# Patient Record
Sex: Female | Born: 1960 | Race: Black or African American | Hispanic: No | Marital: Single | Smoking: Former smoker
Health system: Southern US, Community
[De-identification: ages and names within clinical notes are randomized; demographics above are authoritative.]

## PROBLEM LIST (undated history)

## (undated) DIAGNOSIS — I10 Essential (primary) hypertension: Secondary | ICD-10-CM

## (undated) DIAGNOSIS — C801 Malignant (primary) neoplasm, unspecified: Secondary | ICD-10-CM

## (undated) DIAGNOSIS — J45909 Unspecified asthma, uncomplicated: Secondary | ICD-10-CM

## (undated) DIAGNOSIS — C539 Malignant neoplasm of cervix uteri, unspecified: Secondary | ICD-10-CM

## (undated) DIAGNOSIS — D219 Benign neoplasm of connective and other soft tissue, unspecified: Secondary | ICD-10-CM

## (undated) HISTORY — PX: ECTOPIC PREGNANCY SURGERY: SHX613

## (undated) HISTORY — PX: FRACTURE SURGERY: SHX138

## (undated) HISTORY — PX: CHOLECYSTECTOMY: SHX55

---

## 2007-07-14 ENCOUNTER — Emergency Department (HOSPITAL_COMMUNITY): Admission: EM | Admit: 2007-07-14 | Discharge: 2007-07-14 | Payer: Self-pay | Admitting: Pediatrics

## 2010-08-14 ENCOUNTER — Emergency Department (HOSPITAL_BASED_OUTPATIENT_CLINIC_OR_DEPARTMENT_OTHER)
Admission: EM | Admit: 2010-08-14 | Discharge: 2010-08-15 | Disposition: A | Discharge status: Other Acute Inpatient Hospital | Payer: Medicaid - Out of State | Attending: Emergency Medicine | Admitting: Emergency Medicine

## 2010-08-14 DIAGNOSIS — I10 Essential (primary) hypertension: Secondary | ICD-10-CM | POA: Insufficient documentation

## 2010-08-14 DIAGNOSIS — D259 Leiomyoma of uterus, unspecified: Secondary | ICD-10-CM | POA: Insufficient documentation

## 2010-08-14 DIAGNOSIS — R1013 Epigastric pain: Secondary | ICD-10-CM | POA: Insufficient documentation

## 2010-08-14 DIAGNOSIS — A599 Trichomoniasis, unspecified: Secondary | ICD-10-CM | POA: Insufficient documentation

## 2010-08-14 DIAGNOSIS — J45909 Unspecified asthma, uncomplicated: Secondary | ICD-10-CM | POA: Insufficient documentation

## 2010-08-14 DIAGNOSIS — N390 Urinary tract infection, site not specified: Secondary | ICD-10-CM | POA: Insufficient documentation

## 2010-08-14 DIAGNOSIS — Z79899 Other long term (current) drug therapy: Secondary | ICD-10-CM | POA: Insufficient documentation

## 2010-08-14 DIAGNOSIS — K7689 Other specified diseases of liver: Secondary | ICD-10-CM | POA: Insufficient documentation

## 2010-08-14 DIAGNOSIS — N83209 Unspecified ovarian cyst, unspecified side: Secondary | ICD-10-CM | POA: Insufficient documentation

## 2010-08-15 ENCOUNTER — Inpatient Hospital Stay (HOSPITAL_COMMUNITY): Payer: Medicaid - Out of State

## 2010-08-15 ENCOUNTER — Inpatient Hospital Stay (HOSPITAL_COMMUNITY)
Admission: AD | Admit: 2010-08-15 | Discharge: 2010-08-17 | DRG: 440 | Disposition: A | Discharge status: Home / Self Care | Payer: Medicaid - Out of State | Source: Other Acute Inpatient Hospital | Attending: Internal Medicine | Admitting: Internal Medicine

## 2010-08-15 ENCOUNTER — Emergency Department (INDEPENDENT_AMBULATORY_CARE_PROVIDER_SITE_OTHER): Payer: Medicaid - Out of State

## 2010-08-15 DIAGNOSIS — A5901 Trichomonal vulvovaginitis: Secondary | ICD-10-CM | POA: Diagnosis present

## 2010-08-15 DIAGNOSIS — K7689 Other specified diseases of liver: Secondary | ICD-10-CM | POA: Diagnosis present

## 2010-08-15 DIAGNOSIS — R109 Unspecified abdominal pain: Secondary | ICD-10-CM

## 2010-08-15 DIAGNOSIS — J45909 Unspecified asthma, uncomplicated: Secondary | ICD-10-CM | POA: Diagnosis present

## 2010-08-15 DIAGNOSIS — K5289 Other specified noninfective gastroenteritis and colitis: Secondary | ICD-10-CM | POA: Diagnosis present

## 2010-08-15 DIAGNOSIS — I10 Essential (primary) hypertension: Secondary | ICD-10-CM | POA: Diagnosis present

## 2010-08-15 DIAGNOSIS — G43909 Migraine, unspecified, not intractable, without status migrainosus: Secondary | ICD-10-CM | POA: Diagnosis present

## 2010-08-15 DIAGNOSIS — K859 Acute pancreatitis without necrosis or infection, unspecified: Principal | ICD-10-CM | POA: Diagnosis present

## 2010-08-15 DIAGNOSIS — K7460 Unspecified cirrhosis of liver: Secondary | ICD-10-CM

## 2010-08-15 DIAGNOSIS — D259 Leiomyoma of uterus, unspecified: Secondary | ICD-10-CM | POA: Diagnosis present

## 2010-08-15 DIAGNOSIS — R112 Nausea with vomiting, unspecified: Secondary | ICD-10-CM

## 2010-08-15 DIAGNOSIS — N83209 Unspecified ovarian cyst, unspecified side: Secondary | ICD-10-CM | POA: Diagnosis present

## 2010-08-15 LAB — CBC
HCT: 41.1 % (ref 36.0–46.0)
MCV: 77.7 fL — ABNORMAL LOW (ref 78.0–100.0)
RBC: 5.29 MIL/uL — ABNORMAL HIGH (ref 3.87–5.11)
WBC: 8.4 10*3/uL (ref 4.0–10.5)

## 2010-08-15 LAB — COMPREHENSIVE METABOLIC PANEL
Alkaline Phosphatase: 241 U/L — ABNORMAL HIGH (ref 39–117)
BUN: 8 mg/dL (ref 6–23)
CO2: 27 mEq/L (ref 19–32)
Chloride: 104 mEq/L (ref 96–112)
Glucose, Bld: 112 mg/dL — ABNORMAL HIGH (ref 70–99)
Potassium: 3.5 mEq/L (ref 3.5–5.1)
Total Bilirubin: 2.3 mg/dL — ABNORMAL HIGH (ref 0.3–1.2)

## 2010-08-15 LAB — LIPID PANEL
HDL: 55 mg/dL (ref >39)
Total CHOL/HDL Ratio: 3 RATIO

## 2010-08-15 LAB — URINALYSIS, ROUTINE W REFLEX MICROSCOPIC
Hgb urine dipstick: NEGATIVE
Hgb urine dipstick: NEGATIVE
Ketones, ur: NEGATIVE mg/dL
Nitrite: NEGATIVE
Protein, ur: NEGATIVE mg/dL
Protein, ur: NEGATIVE mg/dL
Urobilinogen, UA: 0.2 mg/dL (ref 0.0–1.0)
Urobilinogen, UA: 1 mg/dL (ref 0.0–1.0)

## 2010-08-15 LAB — URINE MICROSCOPIC-ADD ON

## 2010-08-15 LAB — DIFFERENTIAL
Eosinophils Relative: 3 % (ref 0–5)
Lymphocytes Relative: 17 % (ref 12–46)
Lymphs Abs: 1.5 10*3/uL (ref 0.7–4.0)
Neutrophils Relative %: 74 % (ref 43–77)

## 2010-08-15 LAB — LIPASE, BLOOD: Lipase: 880 U/L — ABNORMAL HIGH (ref 23–300)

## 2010-08-15 LAB — WET PREP, GENITAL: Yeast Wet Prep HPF POC: NONE SEEN

## 2010-08-15 MED ORDER — IOHEXOL 300 MG/ML  SOLN
100.0000 mL | Freq: Once | INTRAMUSCULAR | Status: AC | PRN
  Administered 2010-08-15: 100 mL via INTRAVENOUS

## 2010-08-16 LAB — COMPREHENSIVE METABOLIC PANEL
ALT: 338 U/L — ABNORMAL HIGH (ref 0–35)
Albumin: 3.3 g/dL — ABNORMAL LOW (ref 3.5–5.2)
Alkaline Phosphatase: 129 U/L — ABNORMAL HIGH (ref 39–117)
Chloride: 103 mEq/L (ref 96–112)
Glucose, Bld: 110 mg/dL — ABNORMAL HIGH (ref 70–99)
Potassium: 3.1 mEq/L — ABNORMAL LOW (ref 3.5–5.1)
Sodium: 139 mEq/L (ref 135–145)
Total Bilirubin: 1.2 mg/dL (ref 0.3–1.2)
Total Protein: 6.7 g/dL (ref 6.0–8.3)

## 2010-08-16 LAB — HEPATIC FUNCTION PANEL
AST: 63 U/L — ABNORMAL HIGH (ref 0–37)
Albumin: 3.4 g/dL — ABNORMAL LOW (ref 3.5–5.2)
Alkaline Phosphatase: 142 U/L — ABNORMAL HIGH (ref 39–117)
Total Bilirubin: 0.9 mg/dL (ref 0.3–1.2)

## 2010-08-16 LAB — URINE CULTURE: Culture: NO GROWTH

## 2010-08-17 LAB — COMPREHENSIVE METABOLIC PANEL
AST: 45 U/L — ABNORMAL HIGH (ref 0–37)
BUN: 5 mg/dL — ABNORMAL LOW (ref 6–23)
CO2: 28 mEq/L (ref 19–32)
Chloride: 104 mEq/L (ref 96–112)
Creatinine, Ser: 0.82 mg/dL (ref 0.4–1.2)
GFR calc non Af Amer: >60 mL/min (ref >60)
Glucose, Bld: 108 mg/dL — ABNORMAL HIGH (ref 70–99)
Total Bilirubin: 0.8 mg/dL (ref 0.3–1.2)

## 2010-08-17 LAB — URINE CULTURE
Culture  Setup Time: 201202100332
Special Requests: NEGATIVE

## 2010-08-18 NOTE — H&P (Signed)
Erica Huffman              ACCOUNT NO.:  [id number]  MEDICAL RECORD NO.:  [id number]           PATIENT TYPE:  I  LOCATION:  5019                         FACILITY:  [facility]  PHYSICIAN:  Mariea Stable, MD   DATE OF BIRTH:  1961/05/15  DATE OF ADMISSION:  08/15/2010 DATE OF DISCHARGE:                             HISTORY & PHYSICAL   PRIMARY CARE PHYSICIAN:  Dr. Gillie Manners.  CHIEF COMPLAINT:  Abdominal pain.  HISTORY OF PRESENT ILLNESS:  Erica Huffman is a 50 year old woman who presents with chief complaint of abdominal pain.  Pain started approximately 2 days prior to admission.  She states she had a headache at that time, which is common for her, and she took some NSAIDs followed by epigastric pain that was described as a knotted feeling in the epigastrium associated with cramps.  This was followed by associated nausea and vomiting that actually improved after a few bouts of emesis. She describes the pain has subsequently been markedly relieved and allowing for her to tolerate some food yesterday.  She states she started with a relatively light diet and then was able to advance somewhat.  Today, she has continued to tolerate food but has developed increasing abdominal pain again.  This time it is mainly in the epigastric area but kind of radiates in a circle diffusely around her abdomen.  She states that her right lower quadrant seems to be more painful than other areas.  She has began to have her headache once again.  She again says that this is not a typical for her and she suffers from migraines that are typically relieved by NSAIDs.  Again, the patient states that the pain initially got better but has now returned and is moderate-to-severe in intensity.  She reports not being able to find a comfortable position as pain does not improve despite different positions.  She has not necessarily found anything that makes the pain better and currently no aggravating  triggers.  PAST MEDICAL HISTORY: 1. Asthma. 2. Migraines. 3. Kidney stones. 4. Endometriosis. 5. Hypertension.  MEDICATIONS: 1. She uses ibuprofen or naproxen p.r.n. headache. 2. Albuterol p.r.n. shortness of breath, wheezing. 3. Hydrochlorothiazide 25 mg p.o. daily.  ALLERGIES:  She has no known drug allergies.  SOCIAL HISTORY:  The patient is currently residing in [place] although she still has her apartment in [place].  She has a 40 year old son which is going to high school here.  They reside with her mother while her son is in school.  She states that she frequently returns to [place] to check up on the apartment approximately once a month or so. Her plan was to move to [facility] although she is not sure how likely this will be.  She is currently single and she denies any history of tobacco, alcohol, or drug use.  FAMILY HISTORY:  Negative/noncontributory.  REVIEW OF SYSTEMS:  As per HPI.  All others reviewed and negative.  PHYSICAL EXAM:  VITAL SIGNS:  Temperature 98.7, blood pressure 141/94, pulse of 114, respirations 16, oxygen saturation 96% on room air. GENERAL:  She is an overweight woman lying in bed  in no acute distress. HEENT:  Normocephalic, atraumatic.  Pupils equally round and reactive to light and accommodation.  Extraocular movements are intact.  Sclerae are anicteric.  Mucous membranes are moist. NECK:  Supple.  There is no JVD.  No carotid bruits. HEART:  S1-S2 with a slightly tachy rate, regular rhythm.  No murmurs, gallops or rubs. LUNGS:  Clear to auscultation bilaterally with good air movement. ABDOMEN:  Positive bowel sounds.  It is soft and diffusely tender. Tenderness is most notable in the epigastric area.  There is no rebound or guarding. EXTREMITIES:  There is no cyanosis, clubbing or edema. NEUROLOGIC:  The patient is awake, alert, and oriented x3.  Cranial nerves are intact.  Motor is intact.  Sensation is intact.  LABORATORY DATA:   Urine pregnancy test is negative.  Urinalysis shows small leukocyte esterase with 7-10 wbc's.  WBC 8.4, hemoglobin 13.6, platelet count 253.  Sodium 141, potassium 3.5, chloride 104, bicarb 27, glucose 112, BUN 8, creatinine 0.8, bilirubin 2.3, alkaline phosphatase 241, AST 381, ALT 773, protein 8.0, albumin 4.1, calcium 9.1, lipase 880.  Wet prep significant for few Trichomonas and few WBCs.  IMAGING:  CT abdomen and pelvis with contrast.  Impression: 1. Diffuse hepatic steatosis. 2. Large posterior uterine fibroid 3. Left ovarian cyst. 4. Suspected proximal enteritis. 5. Stranding in the roots of the mesentery, technically nonspecific.  ASSESSMENT AND PLAN: 1. Abdominal pain most likely secondary to pancreatitis (likely     gallstone induced).  The patient has risk factors for gallstones     including female gender, age, and obesity.  Given her current     laboratory data with an elevated bilirubin, elevated alkaline     phosphatase, and ALT double her AST suggest gallstones etiology.     She does have a CT scan without any mentioned, however, this is     less sensitive than abdominal ultrasound.  At this point we will     admit, will hydrate aggressively, keep her n.p.o. and order an     abdominal ultrasound to further assess.  I will provide IV pain     medication along with nausea medications p.r.n.  We will need to     recheck LFTs at some point tomorrow to assess trend as it is     possible that she may have passed a gallstone.  Depending on her     clinical progress and lab values and ultrasound findings, may need     a GI consultation for the ERCP if ultrasound demonstrates a dilated     common bile duct and/or LFTs progressively worsen.  Furthermore, if     the patient is noted to have gallstones, she will likely need a     cholecystectomy at some point to prevent further recurrences.  Less     likely due to alcohol as the patient denies any use of.  Also     possible but  unlikely is hydrochlorothiazide as the etiology as the     patient has been on this medication for sometime.  We will go ahead     and check a fasting lipid panel just to make sure that     triglycerides are not grossly elevated, although again this is most     likely secondary to gallstone given the current laboratory data. 2. Trichomoniasis.  We will treat with a single dose of metronidazole. 3. Elevated transaminases.  Again, this is per #1.  However, if no  evidence of gallstone on ultrasound, may consider further workup      (viral hepatitis, steatohepatitis, etc) if LFTs do not normalize      rapidly.  Again, I presume this is secondary to gallstone and even      if the ultrasound is negative, may be secondary to a stone having had      passed.     Mariea Stable, MD     MA/MEDQ  D:  08/15/2010  T:  08/15/2010  Job:  [id number]  cc:   Gillie Manners, MD  Electronically Signed by Mariea Stable MD on 08/16/2010 01:51:13 PM

## 2010-08-28 NOTE — Discharge Summary (Signed)
NAMEHAYES, REHFELDT              ACCOUNT NO.:  [id number]  MEDICAL RECORD NO.:  [id number]           PATIENT TYPE:  LOCATION:                                 FACILITY:  PHYSICIAN:  Clydia Llano, MD       DATE OF BIRTH:  [birth year]  DATE OF ADMISSION: DATE OF DISCHARGE:                              DISCHARGE SUMMARY   PRIMARY CARE PHYSICIAN:  Dr. Gillie Manners  REASON FOR ADMISSION:  Abdominal pain.  DISCHARGE DIAGNOSES: 1. Acute pancreatitis. 2. Trichomonas vaginitis. 3. Diffuse hepatic steatosis 4. Large posterior uterine fibroid. 5. Left ovarian cyst. 6. Probable proximal enteritis.  SECONDARY DIAGNOSES: 1. Asthma. 2. Migraines. 3. History of endometriosis. 4. History of a kidney stones.  DISCHARGE MEDICATIONS: 1. Albuterol inhaler 90 mcg 1 puff inhaled every 4 hours as needed for     wheezing and shortness of breath. 2. Naprosyn 500 mg 3 times daily as needed for pain.  BRIEF HISTORY AND EXAMINATION:  Erica Huffman is a 50 year old African American female with history of migraines and asthma.  The patient came into the hospital complaining about abdominal pain.  The patient has pain approximately 2 days prior to the admission. She described abdominal pain as colicky epigastric associated with cramps.  Followed by nausea and vomiting and improved actually about few bouts of emesis. The patient described that the pain is markedly relieved, allowed her to tolerate some food the day before admission.  She stated that she started to relatively light diet when she is able to advance somewhat. Anyway at the day of admission, the patient was able to eat light diet, but continued increasing abdominal cramps.  At this time, it was in the epigastric area, radiates to circle diffusely in the abdomen.  She stated that her right lower quadrant seems to be painful more than other areas in her stomach.  Upon initial evaluation in the emergency department, the patient was found to  have lipase of 880 consistent with acute pancreatitis and her wet prep showed few trichomonas.  RADIOLOGY: 1. Ultrasound of abdomen showed cholelithiasis without evidence of     cholecystitis or biliary dilatation.  No sonographic evidence of     complicated pancreatitis. 2. CT of abdomen and pelvis showed diffuse hepatic steatosis, large     posterior uterine fibroid.  Left ovarian cyst and suspected     proximal enteritis and stranding in the root of the mesentry,     technically nonspecific.  BRIEF HOSPITAL COURSE:  The patient is admitted to the hospital for further evaluation. 1. Acute pancreatitis:  The patient is admitted to the hospital and     was put on n.p.o.  Then, her diet advanced as she tolerates food.     The patient was put on PPI during the hospital stay as well as Tums     for heartburn.  The patient was doing better and she was able to     tolerate sodium food since dinner yesterday.  The patient is not a     drinker.  CT scan and ultrasound did not show gallstones.     Triglycerides  were 56, within normal limits.  The patient does not     have to take any medications that can cause pancreatitis.  The     patient's pancreatitis is probably idiopathic.  The patient is to     follow up with her primary care physician. 2. Increased LFTs:  If followed initially.  Thought initially, the     patient has gallstone which caused pancreatitis and increasing the     LFTs, but CT scan and ultrasound did not show evidence of     obstructing gallstone.  The patient has gallstones, but no     cholecystitis or obstructing gallstone in the common bile duct.     The increased LFTs is probably secondary to diffuse hepatic     steatosis.  Also, hepatitis panel was obtained, result was not back     until the time of discharge. 3. Posterior uterine fibroid posterior measured 10 x 1 x 7.2 x 8 cm     and left ovarian cyst measured 2.9 cm in diameter.  These are     incidental findings.   The patient does not have any complaints from     them.  The patient is to follow up with her primary care physician     and instructed if any symptoms or increased vaginal bleeding to     report to her primary care physician or closest ED. 4. Possible enteritis showed in the CT scan.  The patient was treated     symptomatically with PPI and antiemetics.  The patient is     tolerating food okay. 5. Trichomonas vaginitis.  The patient is counseled about safe sex and     to treat her partner as well.  The patient has high dose between 2     g of Flagyl in the ED. 6. Migraine headaches.  The patient is having migraine headaches every     now and then.  During the hospital, one day she had headache which     is relieved by NSAIDs.  DISCHARGE INSTRUCTIONS:  Activity:  As tolerated.  Disposition:  Home.  Diet:  Regular diet.     Clydia Llano, MD     ME/MEDQ  D:  08/17/2010  T:  08/18/2010  Job:  [id number]  cc:   Gillie Manners, MD  Electronically Signed by Clydia Llano  on 08/28/2010 09:26:52 PM

## 2015-04-12 ENCOUNTER — Emergency Department (HOSPITAL_COMMUNITY)
Admission: EM | Admit: 2015-04-12 | Discharge: 2015-04-12 | Disposition: A | Discharge status: Home / Self Care | Payer: Medicaid - Out of State | Attending: Emergency Medicine | Admitting: Emergency Medicine

## 2015-04-12 ENCOUNTER — Emergency Department (EMERGENCY_DEPARTMENT_HOSPITAL)
Admit: 2015-04-12 | Discharge: 2015-04-12 | Disposition: A | Discharge status: Home / Self Care | Payer: Medicaid - Out of State | Attending: Emergency Medicine | Admitting: Emergency Medicine

## 2015-04-12 ENCOUNTER — Encounter (HOSPITAL_COMMUNITY): Payer: Self-pay | Admitting: Emergency Medicine

## 2015-04-12 ENCOUNTER — Emergency Department (HOSPITAL_COMMUNITY): Payer: Medicaid - Out of State

## 2015-04-12 DIAGNOSIS — J45909 Unspecified asthma, uncomplicated: Secondary | ICD-10-CM | POA: Diagnosis not present

## 2015-04-12 DIAGNOSIS — I82402 Acute embolism and thrombosis of unspecified deep veins of left lower extremity: Secondary | ICD-10-CM | POA: Insufficient documentation

## 2015-04-12 DIAGNOSIS — M25552 Pain in left hip: Secondary | ICD-10-CM | POA: Insufficient documentation

## 2015-04-12 DIAGNOSIS — M7989 Other specified soft tissue disorders: Secondary | ICD-10-CM

## 2015-04-12 DIAGNOSIS — I10 Essential (primary) hypertension: Secondary | ICD-10-CM | POA: Diagnosis not present

## 2015-04-12 DIAGNOSIS — Z3202 Encounter for pregnancy test, result negative: Secondary | ICD-10-CM | POA: Insufficient documentation

## 2015-04-12 DIAGNOSIS — R2242 Localized swelling, mass and lump, left lower limb: Secondary | ICD-10-CM | POA: Diagnosis present

## 2015-04-12 DIAGNOSIS — Z8541 Personal history of malignant neoplasm of cervix uteri: Secondary | ICD-10-CM | POA: Diagnosis not present

## 2015-04-12 DIAGNOSIS — Z87891 Personal history of nicotine dependence: Secondary | ICD-10-CM | POA: Insufficient documentation

## 2015-04-12 HISTORY — DX: Malignant (primary) neoplasm, unspecified: C80.1

## 2015-04-12 HISTORY — DX: Unspecified asthma, uncomplicated: J45.909

## 2015-04-12 HISTORY — DX: Essential (primary) hypertension: I10

## 2015-04-12 HISTORY — DX: Malignant neoplasm of cervix uteri, unspecified: C53.9

## 2015-04-12 LAB — BASIC METABOLIC PANEL
ANION GAP: 13 (ref 5–15)
BUN: 12 mg/dL (ref 6–20)
CALCIUM: 9.4 mg/dL (ref 8.9–10.3)
CO2: 25 mmol/L (ref 22–32)
CREATININE: 1.01 mg/dL — AB (ref 0.44–1.00)
Chloride: 102 mmol/L (ref 101–111)
Glucose, Bld: 107 mg/dL — ABNORMAL HIGH (ref 65–99)
Potassium: 3.7 mmol/L (ref 3.5–5.1)
Sodium: 140 mmol/L (ref 135–145)

## 2015-04-12 LAB — I-STAT BETA HCG BLOOD, ED (MC, WL, AP ONLY): I-stat hCG, quantitative: <5 m[IU]/mL (ref <5)

## 2015-04-12 LAB — CBC WITH DIFFERENTIAL/PLATELET
BASOS ABS: 0 10*3/uL (ref 0.0–0.1)
BASOS PCT: 0 %
EOS ABS: 0.1 10*3/uL (ref 0.0–0.7)
EOS PCT: 1 %
HEMATOCRIT: 41.7 % (ref 36.0–46.0)
Hemoglobin: 12.8 g/dL (ref 12.0–15.0)
Lymphocytes Relative: 12 %
Lymphs Abs: 0.8 10*3/uL (ref 0.7–4.0)
MCH: 26 pg (ref 26.0–34.0)
MCHC: 30.7 g/dL (ref 30.0–36.0)
MCV: 84.6 fL (ref 78.0–100.0)
MONO ABS: 0.4 10*3/uL (ref 0.1–1.0)
MONOS PCT: 6 %
Neutro Abs: 5.3 10*3/uL (ref 1.7–7.7)
Neutrophils Relative %: 81 %
PLATELETS: 167 10*3/uL (ref 150–400)
RBC: 4.93 MIL/uL (ref 3.87–5.11)
RDW: 14.5 % (ref 11.5–15.5)
WBC: 6.6 10*3/uL (ref 4.0–10.5)

## 2015-04-12 LAB — PROTIME-INR
INR: 1.16 (ref 0.00–1.49)
Prothrombin Time: 14.9 seconds (ref 11.6–15.2)

## 2015-04-12 MED ORDER — RIVAROXABAN (XARELTO) EDUCATION KIT FOR DVT/PE PATIENTS
PACK | Freq: Once | Status: AC
  Administered 2015-04-12: 19:00:00
  Filled 2015-04-12: qty 1

## 2015-04-12 MED ORDER — RIVAROXABAN 15 MG PO TABS
15.0000 mg | ORAL_TABLET | Freq: Two times a day (BID) | ORAL | Status: DC
  Administered 2015-04-12: 15 mg via ORAL
  Filled 2015-04-12 (×2): qty 1

## 2015-04-12 MED ORDER — OXYCODONE-ACETAMINOPHEN 5-325 MG PO TABS: 1.0000 | ORAL_TABLET | ORAL | Status: DC | PRN

## 2015-04-12 MED ORDER — XARELTO VTE STARTER PACK 15 & 20 MG PO TBPK: 15.0000 mg | ORAL_TABLET | ORAL | Status: DC

## 2015-04-12 MED ORDER — OXYCODONE-ACETAMINOPHEN 5-325 MG PO TABS
1.0000 | ORAL_TABLET | Freq: Once | ORAL | Status: AC
  Administered 2015-04-12: 1 via ORAL
  Filled 2015-04-12: qty 1

## 2015-04-12 MED ORDER — HYDROMORPHONE HCL 1 MG/ML IJ SOLN
1.0000 mg | Freq: Once | INTRAMUSCULAR | Status: AC
Start: 2015-04-12 — End: 2015-04-12
  Administered 2015-04-12: 1 mg via INTRAVENOUS
  Filled 2015-04-12: qty 1

## 2015-04-12 MED ORDER — RIVAROXABAN 20 MG PO TABS: 20.0000 mg | ORAL_TABLET | Freq: Every day | ORAL | Status: DC

## 2015-04-12 NOTE — Discharge Instructions (Addendum)
Information on my medicine - XARELTO (rivaroxaban)  This medication education was reviewed with me or my healthcare representative as part of my discharge preparation.  The pharmacist that spoke with me during my hospital stay was:  Erica Huffman, [place]? Xarelto was prescribed to treat blood clots that may have been found in the veins of your legs (deep vein thrombosis) or in your lungs (pulmonary embolism) and to reduce the risk of them occurring again.  What do you need to know about Xarelto? The starting dose is one 15 mg tablet taken TWICE daily with food for the FIRST 21 DAYS then on  05-04-2015  the dose is changed to one 20 mg tablet taken ONCE A DAY with your evening meal.  DO NOT stop taking Xarelto without talking to the health care provider who prescribed the medication.  Refill your prescription for 20 mg tablets before you run out.  After discharge, you should have regular check-up appointments with your healthcare provider that is prescribing your Xarelto.  In the future your dose may need to be changed if your kidney function changes by a significant amount.  What do you do if you miss a dose? If you are taking Xarelto TWICE DAILY and you miss a dose, take it as soon as you remember. You may take two 15 mg tablets (total 30 mg) at the same time then resume your regularly scheduled 15 mg twice daily the next day.  If you are taking Xarelto ONCE DAILY and you miss a dose, take it as soon as you remember on the same day then continue your regularly scheduled once daily regimen the next day. Do not take two doses of Xarelto at the same time.   Important Safety Information Xarelto is a blood thinner medicine that can cause bleeding. You should call your healthcare provider right away if you experience any of the following: ? Bleeding from an injury or your nose that does not stop. ? Unusual colored urine (red or dark brown) or unusual  colored stools (red or black). ? Unusual bruising for unknown reasons. ? A serious fall or if you hit your head (even if there is no bleeding).  Some medicines may interact with Xarelto and might increase your risk of bleeding while on Xarelto. To help avoid this, consult your healthcare provider or pharmacist prior to using any new prescription or non-prescription medications, including herbals, vitamins, non-steroidal anti-inflammatory drugs (NSAIDs) and supplements.  This website has more information on Xarelto: [email or web address]-benson.com/.  Deep Vein Thrombosis A deep vein thrombosis (DVT) is a blood clot (thrombus) that usually occurs in a deep, larger vein of the lower leg or the pelvis, or in an upper extremity such as the arm. These are dangerous and can lead to serious and even life-threatening complications if the clot travels to the lungs. A DVT can damage the valves in your leg veins so that instead of flowing upward, the blood pools in the lower leg. This is called post-thrombotic syndrome, and it can result in pain, swelling, discoloration, and sores on the leg. CAUSES A DVT is caused by the formation of a blood clot in your leg, pelvis, or arm. Usually, several things contribute to the formation of blood clots. A clot may develop when:  Your blood flow slows down.  Your vein becomes damaged in some way.  You have a condition that makes your blood clot more easily. RISK FACTORS A DVT is more likely  to develop in:  People who are older, especially over 51 years of age.  People who are overweight (obese).  People who sit or lie still for a long time, such as during long-distance travel (over 4 hours), bed rest, hospitalization, or during recovery from certain medical conditions like a stroke.  People who do not engage in much physical activity (sedentary lifestyle).  People who have chronic breathing disorders.  People who have a personal or family history of blood clots or blood  clotting disease.  People who have peripheral vascular disease (PVD), diabetes, or some types of cancer.  People who have heart disease, especially if the person had a recent heart attack or has congestive heart failure.  People who have neurological diseases that affect the legs (leg paresis).  People who have had a traumatic injury, such as breaking a hip or leg.  People who have recently had major or lengthy surgery, especially on the hip, knee, or abdomen.  People who have had a central line placed inside a large vein.  People who take medicines that contain the hormone estrogen. These include birth control pills and hormone replacement therapy.  Pregnancy or during childbirth or the postpartum period.  Long plane flights (over 8 hours). SIGNS AND SYMPTOMS Symptoms of a DVT can include:   Swelling of your leg or arm, especially if one side is much worse.  Warmth and redness of your leg or arm, especially if one side is much worse.  Pain in your arm or leg. If the clot is in your leg, symptoms may be more noticeable or worse when you stand or walk.  A feeling of pins and needles, if the clot is in the arm. The symptoms of a DVT that has traveled to the lungs (pulmonary embolism, PE) usually start suddenly and include:  Shortness of breath while active or at rest.  Coughing or coughing up blood or blood-tinged mucus.  Chest pain that is often worse with deep breaths.  Rapid or irregular heartbeat.  Feeling light-headed or dizzy.  Fainting.  Feeling anxious.  Sweating. There may also be pain and swelling in a leg if that is where the blood clot started. These symptoms may represent a serious problem that is an emergency. Do not wait to see if the symptoms will go away. Get medical help right away. Call your local emergency services (911 in the [place] Do not drive yourself to the hospital. DIAGNOSIS Your health care provider will take a medical history and perform a  physical exam. You may also have other tests, including:  Blood tests to assess the clotting properties of your blood.  Imaging tests, such as CT, ultrasound, MRI, X-ray, and other tests to see if you have clots anywhere in your body. TREATMENT After a DVT is identified, it can be treated. The type of treatment that you receive depends on many factors, such as the cause of your DVT, your risk for bleeding or developing more clots, and other medical conditions that you have. Sometimes, a combination of treatments is necessary. Treatment options may be combined and include:  Monitoring the blood clot with ultrasound.  Taking medicines by mouth, such as newer blood thinners (anticoagulants), thrombolytics, or warfarin.  Taking anticoagulant medicine by injection or through an IV tube.  Wearing compression stockings or using different types ofdevices.  Surgery (rare) to remove the blood clot or to place a filter in your abdomen to stop the blood clot from traveling to your lungs. Treatments  for a DVT are often divided into immediate treatment and long-term treatment (up to 3 months after DVT). You can work with your health care provider to choose the treatment program that is best for you. HOME CARE INSTRUCTIONS If you are taking a newer oral anticoagulant:  Take the medicine every single day at the same time each day.  Understand what foods and drugs interact with this medicine.  Understand that there are no regular blood tests required when using this medicine.  Understand the side effects of this medicine, including excessive bruising or bleeding. Ask your health care provider or pharmacist about other possible side effects. If you are taking warfarin:  Understand how to take warfarin and know which foods can affect how warfarin works in Veterinary surgeon.  Understand that it is dangerous to take too much or too little warfarin. Too much warfarin increases the risk of bleeding. Too little  warfarin continues to allow the risk for blood clots.  Follow your PT and INR blood testing schedule. The PT and INR results allow your health care provider to adjust your dose of warfarin. It is very important that you have your PT and INR tested as often as told by your health care provider.  Avoid major changes in your diet, or tell your health care provider before you change your diet. Arrange a visit with a registered dietitian to answer your questions. Many foods, especially foods that are high in vitamin K, can interfere with warfarin and affect the PT and INR results. Eat a consistent amount of foods that are high in vitamin K, such as:  Spinach, kale, broccoli, cabbage, collard greens, turnip greens, Brussels sprouts, peas, cauliflower, seaweed, and parsley.  Beef liver and pork liver.  Green tea.  Soybean oil.  Tell your health care provider about any and all medicines, vitamins, and supplements that you take, including aspirin and other over-the-counter anti-inflammatory medicines. Be especially cautious with aspirin and anti-inflammatory medicines. Do not take those before you ask your health care provider if it is safe to do so. This is important because many medicines can interfere with warfarin and affect the PT and INR results.  Do not start or stop taking any over-the-counter or prescription medicine unless your health care provider or pharmacist tells you to do so. If you take warfarin, you will also need to do these things:  Hold pressure over cuts for longer than usual.  Tell your dentist and other health care providers that you are taking warfarin before you have any procedures in which bleeding may occur.  Avoid alcohol or drink very small amounts. Tell your health care provider if you change your alcohol intake.  Do not use tobacco products, including cigarettes, chewing tobacco, and e-cigarettes. If you need help quitting, ask your health care provider.  Avoid  contact sports. General Instructions  Take over-the-counter and prescription medicines only as told by your health care provider. Anticoagulant medicines can have side effects, including easy bruising and difficulty stopping bleeding. If you are prescribed an anticoagulant, you will also need to do these things:  Hold pressure over cuts for longer than usual.  Tell your dentist and other health care providers that you are taking anticoagulants before you have any procedures in which bleeding may occur.  Avoid contact sports.  Wear a medical alert bracelet or carry a medical alert card that says you have had a PE.  Ask your health care provider how soon you can go back to your normal  activities. Stay active to prevent new blood clots from forming.  Make sure to exercise while traveling or when you have been sitting or standing for a long period of time. It is very important to exercise. Exercise your legs by walking or by tightening and relaxing your leg muscles often. Take frequent walks.  Wear compression stockings as told by your health care provider to help prevent more blood clots from forming.  Do not use tobacco products, including cigarettes, chewing tobacco, and e-cigarettes. If you need help quitting, ask your health care provider.  Keep all follow-up appointments with your health care provider. This is important. PREVENTION Take these actions to decrease your risk of developing another DVT:  Exercise regularly. For at least 30 minutes every day, engage in:  Activity that involves moving your arms and legs.  Activity that encourages good blood flow through your body by increasing your heart rate.  Exercise your arms and legs every hour during long-distance travel (over 4 hours). Drink plenty of water and avoid drinking alcohol while traveling.  Avoid sitting or lying in bed for long periods of time without moving your legs.  Maintain a weight that is appropriate for your  height. Ask your health care provider what weight is healthy for you.  If you are a woman who is over 38 years of age, avoid unnecessary use of medicines that contain estrogen. These include birth control pills.  Do not smoke, especially if you take estrogen medicines. If you need help quitting, ask your health care provider. If you are hospitalized, prevention measures may include:  Early walking after surgery, as soon as your health care provider says that it is safe.  Receiving anticoagulants to prevent blood clots.If you cannot take anticoagulants, other options may be available, such as wearing compression stockings or using different types of devices. SEEK IMMEDIATE MEDICAL CARE IF:  You have new or increased pain, swelling, or redness in an arm or leg.  You have numbness or tingling in an arm or leg.  You have shortness of breath while active or at rest.  You have chest pain.  You have a rapid or irregular heartbeat.  You feel light-headed or dizzy.  You Huffman up blood.  You notice blood in your vomit, bowel movement, or urine. These symptoms may represent a serious problem that is an emergency. Do not wait to see if the symptoms will go away. Get medical help right away. Call your local emergency services (911 in the [place] Do not drive yourself to the hospital.   This information is not intended to replace advice given to you by your health care provider. Make sure you discuss any questions you have with your health care provider.   Document Released: 06/23/2005 Document Revised: 03/14/2015 Document Reviewed: 10/18/2014 Elsevier Interactive Patient Education [employer].

## 2015-04-12 NOTE — Progress Notes (Signed)
*  PRELIMINARY RESULTS* Vascular Ultrasound Lower extremity venous duplex has been completed.  Preliminary findings: DVT noted in the left common femoral, profunda femoral, femoral, popliteal, posterior tibial, and peroneal veins. Superficial thrombosis of the left greater saphenous vein.  No DVT RLE.    Landry Mellow, RDMS, RVT  04/12/2015, 5:31 PM

## 2015-04-12 NOTE — Care Management Note (Signed)
Case Management Note  Patient Details  Name: Erica Huffman MRN: [id number] Date of Birth: 06-12-1961  Subjective/Objective:   Presented to [facility] ED with leg pain r/o DVT, patient unable to ambulate                 Action/Plan:   Provided Xarelto 30 day free trial card, scheduled appointment for follow with [facility] Dr. Verl Blalock for DVT on Wednesday 10/12 at 2p, Patient states that she is unable to ambulate due to pain and swelling in the left leg. Discussed rolling walker with patient, walker ordered. Walker delivedred to patient prior to discharge. Updated Dr. Ralene Bathe she is agreeable with discharge plan. Reviewed follow up information with patient, and information also placed on AVS. No further ED CM needs identified.  Expected Discharge Date: 04/12/15                 Expected Discharge Plan:     In-House Referral:    Discharge planning Services     Post Acute Care Choice:    Choice offered to:     DME Arranged:    DME Agency:     HH Arranged:    HH Agency:     Status of Service:   completed/ signed off  Medicare Important Message Given:    Date Medicare IM Given:    Medicare IM give by:    Date Additional Medicare IM Given:    Additional Medicare Important Message give by:     If discussed at [place] of Stay Meetings, dates discussed:    Additional CommentsLaurena Slimmer, RN 04/12/2015, 6:59 PM

## 2015-04-12 NOTE — ED Notes (Signed)
Pt given walker by Mariann Laster prior to discharge

## 2015-04-12 NOTE — ED Notes (Signed)
Pt st's she rolled over in bed yesterday and felt a pop in left groin area, st's today her entire left leg is swollen and painful

## 2015-04-12 NOTE — ED Provider Notes (Signed)
CSN: [id number]     Arrival date & time 04/12/15  1601 History   First MD Initiated Contact with Patient 04/12/15 1614     Chief Complaint  Patient presents with  . Leg Swelling   Erica Huffman is a 54 y.o. female with a past medical history significant for cervical cancer, asthma, hypertension, and prior DVT who presents with left hip pain and left leg swelling. The patient reports that she received radiation therapy to her pelvis this year for her cervical cancer and due to this, the patient has chronic bilateral hip pain. The patient says she was lying in bed yesterday when she tried to twist causing a popping sensation and onset of pain in her left hip and left leg. The patient described the pain as severe and gradually worsening. The patient reports that she did not want to seek evaluation initially however, with the gradually worsening pain and swelling down the entirety of the left leg, the patient presents today for evaluation. The patient denies any recent fevers, chills, chest pain, shortness of breath, palpitations, nausea, vomiting, constipation, diarrhea, dysuria, vaginal discharge or vaginal bleeding. The patient denies any specific trauma or falls to the left hip or left leg. No other complaints on arrival.   (Consider location/radiation/quality/duration/timing/severity/associated sxs/prior Treatment) Patient is a 54 y.o. female presenting with leg pain. The history is provided by the patient. No language interpreter was used.  Leg Pain Location:  Leg and hip Time since incident:  1 day Lower extremity injury: Twisting in bed.   Leg location:  L leg, L upper leg and L lower leg Pain details:    Quality:  Aching   Radiates to:  Does not radiate   Severity:  Moderate   Onset quality:  Gradual   Timing:  Constant   Progression:  Worsening Chronicity:  New Foreign body present:  No foreign bodies Tetanus status:  Unknown Prior injury to area:  No Relieved by:   Movement Worsened by:  Nothing tried Ineffective treatments:  None tried Associated symptoms: swelling   Associated symptoms: no back pain, no fever, no neck pain, no numbness, no stiffness and no tingling     Past Medical History  Diagnosis Date  . Cancer ([place])   . Cervical cancer ([place])   . Asthma   . Hypertension    Past Surgical History  Procedure Laterality Date  . Ectopic pregnancy surgery    . Cholecystectomy    . Fracture surgery     No family history on file. Social History  Substance Use Topics  . Smoking status: Former Research scientist (life sciences)  . Smokeless tobacco: None  . Alcohol Use: No   OB History    No data available     Review of Systems  Constitutional: Negative for fever, chills and diaphoresis.  HENT: Negative for congestion and rhinorrhea.   Respiratory: Negative for chest tightness, shortness of breath, wheezing and stridor.   Cardiovascular: Positive for leg swelling. Negative for chest pain and palpitations.  Gastrointestinal: Negative for nausea, vomiting, diarrhea and constipation.  Genitourinary: Negative for dysuria and flank pain.  Musculoskeletal: Negative for back pain, stiffness, neck pain and neck stiffness.  Skin: Negative for rash and wound.  Neurological: Negative for dizziness, weakness and headaches.  Psychiatric/Behavioral: Negative for confusion and agitation.  All other systems reviewed and are negative.     Allergies  Review of patient's allergies indicates no known allergies.  Home Medications   Prior to Admission medications   Not on  File   BP 119/74 mmHg  Pulse 89  Temp(Src) 98.2 F (36.8 C) (Oral)  Resp 16  Wt 213 lb 11.2 oz (96.934 kg)  SpO2 99% Physical Exam  Constitutional: She is oriented to person, place, and time. She appears well-developed and well-nourished. No distress.  HENT:  Head: Normocephalic and atraumatic.  Eyes: Conjunctivae and EOM are normal. Pupils are equal, round, and reactive to light.  Neck: Normal  range of motion.  Cardiovascular: Normal rate, regular rhythm, normal heart sounds and intact distal pulses.   No murmur heard. Pulmonary/Chest: Effort normal and breath sounds normal. No stridor. No respiratory distress. She exhibits no tenderness.  Abdominal: Soft. Bowel sounds are normal. She exhibits no distension and no mass. There is no tenderness. There is no rebound.  Musculoskeletal: She exhibits edema and tenderness.       Left hip: She exhibits tenderness and swelling. She exhibits normal range of motion, no deformity and no laceration.       Legs: Neurological: She is alert and oriented to person, place, and time. She exhibits normal muscle tone.  Skin: Skin is warm. She is not diaphoretic.  Psychiatric: She has a normal mood and affect.  Nursing note and vitals reviewed.   ED Course  Procedures (including critical care time) Labs Review Labs Reviewed  BASIC METABOLIC PANEL - Abnormal; Notable for the following:    Glucose, Bld 107 (*)    Creatinine, Ser 1.01 (*)    All other components within normal limits  CBC WITH DIFFERENTIAL/PLATELET  PROTIME-INR  I-STAT BETA HCG BLOOD, ED ([facility], [facility], [facility] ONLY)    Imaging Review Dg Pelvis 1-2 Views  04/12/2015   CLINICAL DATA:  Left hip pain  EXAM: PELVIS - 1-2 VIEW  COMPARISON:  None.  FINDINGS: No acute fracture.  No dislocation.  No destructive bone lesion.  IMPRESSION: No acute bony pathology.   Electronically Signed   By: Marybelle Killings M.D.   On: 04/12/2015 17:52   Dg Femur Min 2 Views Left  04/12/2015   CLINICAL DATA:  Left hip pain  EXAM: LEFT FEMUR 2 VIEWS  COMPARISON:  None.  FINDINGS: No acute fracture. No dislocation. No destructive bone lesion. Mild degenerative change of the knee joint.  IMPRESSION: No acute bony pathology.   Electronically Signed   By: Marybelle Killings M.D.   On: 04/12/2015 17:53   I have personally reviewed and evaluated these images and lab results as part of my medical decision-making.   EKG  Interpretation None      MDM   Erica Huffman is a 54 y.o. female with a past medical history significant for cervical cancer, asthma, hypertension, and prior DVT who presents with left hip pain and left leg swelling. Based on the patient's report of a sudden pop causing her pain, in the setting of prior cancer, and radiation suspect pathologic fracture. Given the patient's history of active cancer and history of DVT with the swelling, a large left-sided DVT is also in the differential diagnosis. The patient's exam revealed tenderness to palpation down the length of the left leg with edema. The patient had mild tenderness to palpation in her left hip however she could range her hip. Next  The patient was given pain medicine and will have x-rays and DVT study obtained.  The patient's images did not reveal fracture however, the DVT study did reveal large left DVT. The patient will be started on several toe and a discussion was held about bleeding  risks and risks for pulmonary embolism. The pharmacist came and spoke with the patient for education on the medication. The patient will be discharged with plans to follow-up for outpatient management of her new DVT. The patient will be given return precautions for signs and symptoms of worsening clot burden, pulmonary embolism, infection, and stroke symptoms in the small chance there is a PFO.  The patient had no other questions, concerns, or complaints and was discharged in good condition.   This patient was seen with Dr. Ralene Bathe, emergency medicine attending.   Final diagnoses:  Left hip pain  DVT (deep venous thrombosis), left       Antony Blackbird, MD 04/13/15 [id number]  Quintella Reichert, MD 04/14/15 1312

## 2015-04-12 NOTE — ED Notes (Signed)
MD at bedside. 

## 2015-04-18 ENCOUNTER — Encounter: Payer: Self-pay | Admitting: Cardiology

## 2015-04-18 ENCOUNTER — Other Ambulatory Visit: Payer: Self-pay

## 2015-04-18 ENCOUNTER — Ambulatory Visit: Payer: Medicaid - Out of State | Attending: Cardiology | Admitting: Cardiology

## 2015-04-18 VITALS — BP 128/80 | HR 89 | Temp 97.9°F | Resp 18 | Ht 64.0 in | Wt 216.4 lb

## 2015-04-18 DIAGNOSIS — I82412 Acute embolism and thrombosis of left femoral vein: Secondary | ICD-10-CM | POA: Diagnosis not present

## 2015-04-18 DIAGNOSIS — I82409 Acute embolism and thrombosis of unspecified deep veins of unspecified lower extremity: Secondary | ICD-10-CM | POA: Insufficient documentation

## 2015-04-18 MED ORDER — RIVAROXABAN 20 MG PO TABS: 20.0000 mg | ORAL_TABLET | Freq: Every day | ORAL | Status: DC

## 2015-04-18 NOTE — Assessment & Plan Note (Signed)
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 She will remain on Xarelto for a total of 6 months. However, as I discussed with her today, because of history of pelvic radiation and previous DVT of the right lower extremity she may need long-term anticoagulation. This can be addressed by her oncologist and medical team in [place] when she returns. I've advised her to stay here until the swelling remarkably improves and the pain has improved in the left lower extremity. At that time she can be a passenger in the backseat of a car with her leg elevated. She's been advised to keep her leg elevated as much as possible during the day and night. All questions answered.

## 2015-04-18 NOTE — Progress Notes (Signed)
HPI Erica Huffman is a 54 year old Afro-American female from [place] who is follow-up after being evaluated in the emergency room with an extensive left lower extremity DVT. She is on anticoagulation with a starter pack of Xarelto. She's had no side effects including no bleeding.  She gives a history of having a right lower extremity DVT in the winter of 16. She has a history of metastatic cervical cancer treated with chemotherapy and radiation therapy and 2015. She has a remaining lymph nodes positive in the left pelvis. She is to follow-up with her oncology team in [place].  Her swelling is starting to improve in her left lower extremity. The pain is also improving.  She denies any shortness of breath, dyspnea, or hemoptysis.  Past Medical History  Diagnosis Date  . Cancer ([place])   . Cervical cancer ([place])   . Asthma   . Hypertension     Current Outpatient Prescriptions  Medication Sig Dispense Refill  . albuterol (PROVENTIL HFA;VENTOLIN HFA) 108 (90 BASE) MCG/ACT inhaler Inhale 2 puffs into the lungs every 6 (six) hours as needed for wheezing or shortness of breath.    . hydrochlorothiazide (HYDRODIURIL) 25 MG tablet Take 25 mg by mouth daily.    Marland Kitchen oxyCODONE-acetaminophen (PERCOCET/ROXICET) 5-325 MG tablet Take 1 tablet by mouth every 4 (four) hours as needed for severe pain. 30 tablet 0  . XARELTO STARTER PACK 15 & 20 MG TBPK Take 15-20 mg by mouth as directed. Take as directed on package: Start with one 15mg  tablet by mouth twice a day with food. On Day 22, switch to one 20mg  tablet once a day with food. 51 each 0   No current facility-administered medications for this visit.    Allergies  Allergen Reactions  . Shellfish Allergy Anaphylaxis and Swelling    History reviewed. No pertinent family history.  Social History   Social History  . Marital Status: Single    Spouse Name: N/A  . Number of Children: N/A  . Years of Education: N/A   Occupational History  . Not on file.    Social History Main Topics  . Smoking status: Former Research scientist (life sciences)  . Smokeless tobacco: Not on file  . Alcohol Use: No  . Drug Use: No  . Sexual Activity: Not on file   Other Topics Concern  . Not on file   Social History Narrative    ROS ALL NEGATIVE EXCEPT THOSE NOTED IN HPI  PE  General Appearance: well developed, well nourished in no acute distress HEENT: symmetrical face, PERRLA, good dentition  Neck: no JVD, thyromegaly, or adenopathy, trachea midline Chest: symmetric without deformity Cardiac: PMI non-displaced, RRR, normal S1, S2, no gallop or murmur Lung: clear to ausculation and percussion Vascular: all pulses full without bruits  Abdominal: nondistended, nontender, good bowel sounds, no HSM, no bruits Extremities: 2+ pretibial edema in the left lower extremity. Pulses intact in the foot. Skin: normal color, no rashes Neuro: alert and oriented x 3, non-focal Pysch: normal affect  EKG  BMET    Component Value Date/Time   NA 140 04/12/2015 1700   K 3.7 04/12/2015 1700   CL 102 04/12/2015 1700   CO2 25 04/12/2015 1700   GLUCOSE 107* 04/12/2015 1700   BUN 12 04/12/2015 1700   CREATININE 1.01* 04/12/2015 1700   CALCIUM 9.4 04/12/2015 1700   GFRNONAA >60 04/12/2015 1700   GFRAA >60 04/12/2015 1700    Lipid Panel     Component Value Date/Time   CHOL  08/15/2010 0848    166        ATP III CLASSIFICATION:  <200     mg/dL   Desirable  200-239  mg/dL   Borderline High  >=240    mg/dL   High          TRIG 56 08/15/2010 0848   HDL 55 08/15/2010 0848   CHOLHDL 3.0 08/15/2010 0848   VLDL 11 08/15/2010 0848   LDLCALC * 08/15/2010 0848    100        Total Cholesterol/HDL:CHD Risk Coronary Heart Disease Risk Table                     Men   Women  1/2 Average Risk   3.4   3.3  Average Risk       5.0   4.4  2 X Average Risk   9.6   7.1  3 X Average Risk  23.4   11.0        Use the calculated Patient Ratio above and the CHD Risk Table to determine the  patient's CHD Risk.        ATP III CLASSIFICATION (LDL):  <100     mg/dL   Optimal  100-129  mg/dL   Near or Above                    Optimal  130-159  mg/dL   Borderline  160-189  mg/dL   High  >190     mg/dL   Very High    CBC    Component Value Date/Time   WBC 6.6 04/12/2015 1700   RBC 4.93 04/12/2015 1700   HGB 12.8 04/12/2015 1700   HCT 41.7 04/12/2015 1700   PLT 167 04/12/2015 1700   MCV 84.6 04/12/2015 1700   MCH 26.0 04/12/2015 1700   MCHC 30.7 04/12/2015 1700   RDW 14.5 04/12/2015 1700   LYMPHSABS 0.8 04/12/2015 1700   MONOABS 0.4 04/12/2015 1700   EOSABS 0.1 04/12/2015 1700   BASOSABS 0.0 04/12/2015 1700

## 2015-04-18 NOTE — Patient Instructions (Addendum)
Thanks for coming in to see Dr.Wall today.  Please follow up with Dr.Wall on December 21st.  Refrain from traveling back to [place] for about 3 weeks or until your swelling and pain has gotten better. Continue to elevate your leg and continue taking Xarelto as prescribed.

## 2015-04-18 NOTE — Progress Notes (Signed)
Patient referred by ED for F/U of DVT in left leg. Pt reports pain in left leg rated at an 8. Pain started last Wednesday and is constant. Pain starts mid spine and radiates down to knee. Patient states pain is more severe in her thigh and knee. Pain described as aching in thigh and knee and sore in inner thigh. Pt has taken her medications today. Pt denies any chest pain, wheezing, and SOB. Pt reports swelling in her left leg. Pt states she can feel the tightness in her legs. Pt asked for information about a compression boot.

## 2015-05-02 ENCOUNTER — Encounter: Payer: Self-pay | Admitting: Cardiology

## 2015-05-02 ENCOUNTER — Telehealth: Payer: Self-pay

## 2015-05-02 ENCOUNTER — Ambulatory Visit: Payer: Medicaid - Out of State | Attending: Cardiology | Admitting: Cardiology

## 2015-05-02 VITALS — BP 124/89 | HR 81 | Temp 98.1°F | Resp 20 | Ht 64.0 in | Wt 210.8 lb

## 2015-05-02 DIAGNOSIS — I82402 Acute embolism and thrombosis of unspecified deep veins of left lower extremity: Secondary | ICD-10-CM | POA: Diagnosis not present

## 2015-05-02 DIAGNOSIS — M7989 Other specified soft tissue disorders: Secondary | ICD-10-CM | POA: Diagnosis not present

## 2015-05-02 MED ORDER — HYDROCHLOROTHIAZIDE 25 MG PO TABS: 25.0000 mg | ORAL_TABLET | Freq: Every day | ORAL | Status: DC

## 2015-05-02 NOTE — Telephone Encounter (Signed)
Returned patient call. Patient verified name and date of birth. Patient expressed that she is still having pain and swelling in her leg. Asked patient if she could come in today at 2pm to be seen by the cardiologist. Patient agreed and stated she will be here today at 2pm.

## 2015-05-02 NOTE — Patient Instructions (Signed)
Thank you for visiting with Dr. Verl Blalock. Follow-up if symptoms persist or worsen

## 2015-05-02 NOTE — Progress Notes (Signed)
Erica Huffman comes in today as an add-on because of increased swelling in her left lower extremity from extensive deep vein thrombosis.  She denies any chest pain or shortness of breath. She has been compliant with her anti-thrombin.  Her exam today shows 1+ pretibial edema with good pulses in the ankle and foot. There is no sign of cellulitis or phlebitis or skin breakdown.

## 2015-05-02 NOTE — Progress Notes (Signed)
Patient complains of pain in groan area and a burning in the back of left thigh. Pain scaled at a 5.   Patient complains of numbness and tingling in left foot and toes. Patient complains of swelling in left foot being present.  Patient needs refill on HCTZ, Patient states she needs a pain medication. Patient has been taking Tylenol 325 and has needed to take medication more than recommended dose in order to control pain.

## 2015-05-02 NOTE — Telephone Encounter (Signed)
Pt. Called stating that she went to the ED on 04/12/15 for DVT. Pt. Stated her legs are still swollen and would like to know what to do. Please f/u with pt.

## 2015-05-02 NOTE — Assessment & Plan Note (Signed)
Her swelling initially got better after being seen in the clinic. It has now increased. She's also out of her hydrochlorothiazide. I renewed the latter and asked her to keep her leg elevated above her heart as much as possible during the day. When her swelling starts improve advised her to get back to [place] to her physicians there particularly oncology. She will get around the back seat with her leg elevated with a driver.

## 2015-06-27 ENCOUNTER — Ambulatory Visit: Payer: Medicaid - Out of State | Admitting: Cardiology

## 2015-08-01 ENCOUNTER — Ambulatory Visit: Payer: Medicaid - Out of State | Admitting: Critical Care Medicine

## 2015-08-04 ENCOUNTER — Other Ambulatory Visit: Payer: Self-pay | Admitting: Cardiology

## 2015-08-14 ENCOUNTER — Other Ambulatory Visit: Payer: Self-pay | Admitting: *Deleted

## 2015-08-14 NOTE — Telephone Encounter (Signed)
Patient was last seen by Dr. Verl Blalock on 05/02/15. Patient is requesting a refill on HCTZ

## 2015-08-16 MED ORDER — HYDROCHLOROTHIAZIDE 25 MG PO TABS: 25.0000 mg | ORAL_TABLET | Freq: Every day | ORAL | Status: DC

## 2015-08-16 NOTE — Telephone Encounter (Signed)
Patients Hydrochlorothiazide has been refilled with 1 additional refill.

## 2015-09-27 ENCOUNTER — Telehealth: Payer: Self-pay | Admitting: Gynecologic Oncology

## 2015-09-27 NOTE — Telephone Encounter (Signed)
Pt call to inquiry about our physicans and what to do to transfer care from [place].

## 2015-10-02 ENCOUNTER — Encounter (HOSPITAL_COMMUNITY): Payer: Self-pay

## 2015-10-02 ENCOUNTER — Inpatient Hospital Stay (HOSPITAL_COMMUNITY)
Admission: EM | Admit: 2015-10-02 | Discharge: 2015-10-04 | DRG: 300 | Disposition: A | Discharge status: Home / Self Care | Payer: Medicaid - Out of State | Attending: Internal Medicine | Admitting: Internal Medicine

## 2015-10-02 ENCOUNTER — Emergency Department (HOSPITAL_COMMUNITY): Payer: Medicaid - Out of State

## 2015-10-02 DIAGNOSIS — Z9221 Personal history of antineoplastic chemotherapy: Secondary | ICD-10-CM

## 2015-10-02 DIAGNOSIS — I447 Left bundle-branch block, unspecified: Secondary | ICD-10-CM | POA: Diagnosis present

## 2015-10-02 DIAGNOSIS — Z7901 Long term (current) use of anticoagulants: Secondary | ICD-10-CM

## 2015-10-02 DIAGNOSIS — C786 Secondary malignant neoplasm of retroperitoneum and peritoneum: Secondary | ICD-10-CM | POA: Diagnosis present

## 2015-10-02 DIAGNOSIS — Z8249 Family history of ischemic heart disease and other diseases of the circulatory system: Secondary | ICD-10-CM

## 2015-10-02 DIAGNOSIS — N179 Acute kidney failure, unspecified: Secondary | ICD-10-CM | POA: Diagnosis not present

## 2015-10-02 DIAGNOSIS — I10 Essential (primary) hypertension: Secondary | ICD-10-CM | POA: Diagnosis present

## 2015-10-02 DIAGNOSIS — I82409 Acute embolism and thrombosis of unspecified deep veins of unspecified lower extremity: Secondary | ICD-10-CM | POA: Diagnosis present

## 2015-10-02 DIAGNOSIS — Z91013 Allergy to seafood: Secondary | ICD-10-CM

## 2015-10-02 DIAGNOSIS — R1032 Left lower quadrant pain: Secondary | ICD-10-CM

## 2015-10-02 DIAGNOSIS — I82423 Acute embolism and thrombosis of iliac vein, bilateral: Secondary | ICD-10-CM | POA: Diagnosis not present

## 2015-10-02 DIAGNOSIS — Z9049 Acquired absence of other specified parts of digestive tract: Secondary | ICD-10-CM

## 2015-10-02 DIAGNOSIS — C539 Malignant neoplasm of cervix uteri, unspecified: Secondary | ICD-10-CM | POA: Diagnosis present

## 2015-10-02 DIAGNOSIS — R000 Tachycardia, unspecified: Secondary | ICD-10-CM | POA: Diagnosis present

## 2015-10-02 DIAGNOSIS — C787 Secondary malignant neoplasm of liver and intrahepatic bile duct: Secondary | ICD-10-CM | POA: Diagnosis present

## 2015-10-02 DIAGNOSIS — E860 Dehydration: Secondary | ICD-10-CM | POA: Diagnosis present

## 2015-10-02 DIAGNOSIS — C7801 Secondary malignant neoplasm of right lung: Secondary | ICD-10-CM | POA: Diagnosis present

## 2015-10-02 DIAGNOSIS — Z79899 Other long term (current) drug therapy: Secondary | ICD-10-CM

## 2015-10-02 DIAGNOSIS — J4520 Mild intermittent asthma, uncomplicated: Secondary | ICD-10-CM | POA: Diagnosis not present

## 2015-10-02 DIAGNOSIS — Z923 Personal history of irradiation: Secondary | ICD-10-CM

## 2015-10-02 DIAGNOSIS — I82402 Acute embolism and thrombosis of unspecified deep veins of left lower extremity: Secondary | ICD-10-CM | POA: Diagnosis not present

## 2015-10-02 DIAGNOSIS — R1030 Lower abdominal pain, unspecified: Secondary | ICD-10-CM

## 2015-10-02 DIAGNOSIS — D259 Leiomyoma of uterus, unspecified: Secondary | ICD-10-CM | POA: Diagnosis present

## 2015-10-02 DIAGNOSIS — R180 Malignant ascites: Secondary | ICD-10-CM | POA: Diagnosis present

## 2015-10-02 DIAGNOSIS — D7389 Other diseases of spleen: Secondary | ICD-10-CM | POA: Diagnosis present

## 2015-10-02 DIAGNOSIS — D509 Iron deficiency anemia, unspecified: Secondary | ICD-10-CM | POA: Diagnosis present

## 2015-10-02 DIAGNOSIS — Z87891 Personal history of nicotine dependence: Secondary | ICD-10-CM

## 2015-10-02 DIAGNOSIS — I82523 Chronic embolism and thrombosis of iliac vein, bilateral: Principal | ICD-10-CM | POA: Diagnosis present

## 2015-10-02 DIAGNOSIS — O2230 Deep phlebothrombosis in pregnancy, unspecified trimester: Secondary | ICD-10-CM | POA: Diagnosis present

## 2015-10-02 DIAGNOSIS — J45909 Unspecified asthma, uncomplicated: Secondary | ICD-10-CM | POA: Diagnosis present

## 2015-10-02 HISTORY — DX: Benign neoplasm of connective and other soft tissue, unspecified: D21.9

## 2015-10-02 LAB — CBC WITH DIFFERENTIAL/PLATELET
Basophils Absolute: 0 K/uL (ref 0.0–0.1)
Basophils Relative: 0 %
Eosinophils Absolute: 0.1 K/uL (ref 0.0–0.7)
Eosinophils Relative: 2 %
HCT: 28.8 % — ABNORMAL LOW (ref 36.0–46.0)
Hemoglobin: 8.5 g/dL — ABNORMAL LOW (ref 12.0–15.0)
Lymphocytes Relative: 20 %
Lymphs Abs: 1.1 K/uL (ref 0.7–4.0)
MCH: 20.9 pg — ABNORMAL LOW (ref 26.0–34.0)
MCHC: 29.5 g/dL — ABNORMAL LOW (ref 30.0–36.0)
MCV: 70.8 fL — ABNORMAL LOW (ref 78.0–100.0)
Monocytes Absolute: 0.5 K/uL (ref 0.1–1.0)
Monocytes Relative: 10 %
Neutro Abs: 3.6 K/uL (ref 1.7–7.7)
Neutrophils Relative %: 68 %
Platelets: 518 K/uL — ABNORMAL HIGH (ref 150–400)
RBC: 4.07 MIL/uL (ref 3.87–5.11)
RDW: 20.4 % — ABNORMAL HIGH (ref 11.5–15.5)
WBC: 5.3 K/uL (ref 4.0–10.5)

## 2015-10-02 LAB — BASIC METABOLIC PANEL WITH GFR
Anion gap: 13 (ref 5–15)
BUN: 10 mg/dL (ref 6–20)
CO2: 29 mmol/L (ref 22–32)
Calcium: 9.1 mg/dL (ref 8.9–10.3)
Chloride: 92 mmol/L — ABNORMAL LOW (ref 101–111)
Creatinine, Ser: 1.25 mg/dL — ABNORMAL HIGH (ref 0.44–1.00)
GFR calc Af Amer: 56 mL/min — ABNORMAL LOW
GFR calc non Af Amer: 48 mL/min — ABNORMAL LOW
Glucose, Bld: 110 mg/dL — ABNORMAL HIGH (ref 65–99)
Potassium: 4.3 mmol/L (ref 3.5–5.1)
Sodium: 134 mmol/L — ABNORMAL LOW (ref 135–145)

## 2015-10-02 LAB — PROTIME-INR
INR: 1.31 (ref 0.00–1.49)
Prothrombin Time: 16.4 s — ABNORMAL HIGH (ref 11.6–15.2)

## 2015-10-02 LAB — POC OCCULT BLOOD, ED: Fecal Occult Bld: NEGATIVE

## 2015-10-02 MED ORDER — MORPHINE SULFATE (PF) 4 MG/ML IV SOLN
4.0000 mg | Freq: Once | INTRAVENOUS | Status: AC
  Administered 2015-10-02: 4 mg via INTRAVENOUS
  Filled 2015-10-02: qty 1

## 2015-10-02 MED ORDER — IOPAMIDOL (ISOVUE-300) INJECTION 61%
100.0000 mL | Freq: Once | INTRAVENOUS | Status: AC | PRN
  Administered 2015-10-02: 100 mL via INTRAVENOUS

## 2015-10-02 NOTE — ED Notes (Signed)
Pt has hx of DVT in bilateral legs.  More so on left side.  Started a couple of days ago.  Pt takes lovenox.  No shortness of breath.  Stents in left leg.

## 2015-10-02 NOTE — H&P (Addendum)
[facility] History and Physical  Erica Huffman [id number] DOB: 08-Feb-1961 DOA: 10/02/2015  Referring physician: ED physician PCP: Pcp Not In System  Specialists:   Chief Complaint:   HPI: Erica Huffman is a 55 y.o. 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  Pt reports that she is currently taking lovenox for DVT100mg  BID. In the past several days, she has worsening left groin pain and left leg swelling. She denies chest pain or shortness of breast. No active bleeding, no hematochezia or hematuria. Patient has chronic lower abdominal pain which she attributes to cervical cancer, but no nausea vomiting or diarrhea. Patient does not have fever or chills. She denies symptoms of UTI or unilateral weakness. No hx of PE. EDP spoke with interventional radiology who recommends CT venogram to further evaluate IVC filter and iliac veins.   In ED, patient was found to have hemoglobin dropped from 12.8 on 04/12/15-->8.5 today, negative FOBT, WBC 5.3, temperature normal, tachycardia, acute renal injury with creatinine 1.25. CT-venogram: CT venogram reveals patent IVC however, the long segment stents which extend from the external iliac veins into the IVC bilaterally are thrombosed. There is extensive venous collateralization which bypasses this obstruction. Patient also has peritoneal carcinomatosis with malignant ascites, suspicious splenic lesions as well as a large hypovascular metastatic lesion in the right lobe of the liver. Patient also has markedly enlarged ovaries which is concerning for primary ovarian neoplasm in addition to her primary cervical neoplasm. Patient is admitted to inpatient for further infection treatment.  #: EDP has taken a good hx of this pt which is really appreciated.  Per EDP's  note, "Patient has active cervical cancer for which she completed chemotherapy and radiation one year ago. The cervical cancer has returned and she is not actively being treated for this. In 04/2015 patient was diagnosed with a DVT in her left popliteal region. She failed outpatient therapy on Xarelto as well as Coumadin. She had stent placed in her popliteal region which then failed.The DVT was found in [place] for the remainder of her treatment occurred in [place]. She just returned to in to see one week ago. After the stent failed, patient as hospitalized for 2 months in [place] in 06/2015 andunderwent procedure again and had IVC filter placed as well as venoplasty of left external iliac, common femoral and a central deep femoral vein. Per patient she has stent placed in both, femoral veins. Patient has trouble with intermittent pain and swelling in her lower extremities despite outpatient anticoagulation. Patient states that she was supposed to undergo a procedure last week to help open up her stents, but was unable to and she was moving back to [place]. She has not established care with a vascular surgeon in this area yet. She did have all of her medical records shipped to an oncologist in this area, but is unsure of which one.She has not been seen by physician since she has been [place].atient states that she has beengoing through physical therapy and rehabilitation over the last monthin order to learn how to walk again. Patient states she is able to ambulate with a walker".     EKG: Independently reviewed. QTC 509, LBBB  Where does patient live?   At home Can patient participate in ADLs?  None   Review of Systems:  General: no fevers, chills, has poor appetite, has fatigue HEENT: no blurry vision, hearing changes or sore throat Pulm: no dyspnea, coughing, wheezing CV: no chest pain, no palpitations Abd: no nausea, vomiting, has abdominal pain, no diarrhea,  constipation GU: no dysuria, burning on urination, increased urinary frequency, hematuria  Ext: no leg edema Neuro: no unilateral weakness, numbness, or tingling, no vision change or hearing loss Skin: no rash MSK: No muscle spasm, no deformity, no limitation of range of movement in spin Heme: No easy bruising.  Travel history: No recent long distant travel.  Allergy:  Allergies  Allergen Reactions  . Shellfish Allergy Anaphylaxis and Swelling    Past Medical History  Diagnosis Date  . Cancer ([place])   . Cervical cancer ([place])   . Asthma   . Hypertension   . Fibroid     Past Surgical History  Procedure Laterality Date  . Ectopic pregnancy surgery    . Cholecystectomy    . Fracture surgery      Social History:  reports that she has quit smoking. She does not have any smokeless tobacco history on file. She reports that she does not drink alcohol or use illicit drugs.  Family History:  Family History  Problem Relation Age of Onset  . Hypertension Mother      Prior to Admission medications   Medication Sig Start Date End Date Taking? Authorizing Provider  acetaminophen (TYLENOL) 325 MG tablet Take 325 mg by mouth every 4 (four) hours as needed for moderate pain.    Yes Historical Provider, MD  albuterol (PROVENTIL HFA;VENTOLIN HFA) 108 (90 BASE) MCG/ACT inhaler Inhale 2 puffs into the lungs every 6 (six) hours as needed for wheezing or shortness of breath.   Yes Historical Provider, MD  cyclobenzaprine (FLEXERIL) 10 MG tablet Take 10 mg by mouth 2 (two) times daily.   Yes Historical Provider, MD  docusate sodium (COLACE) 100 MG capsule Take 100 mg by mouth 2 (two) times daily.   Yes Historical Provider, MD  enoxaparin (LOVENOX) 100 MG/ML injection Inject 100 mg into the skin every 12 (twelve) hours.   Yes Historical Provider, MD  guaiFENesin (ROBITUSSIN) 100 MG/5ML liquid Take 200 mg by mouth 2 (two) times daily as needed for cough.   Yes Historical Provider, MD   hydrochlorothiazide (HYDRODIURIL) 25 MG tablet Take 1 tablet (25 mg total) by mouth daily. 08/16/15  Yes Tresa Garter, MD  Multiple Vitamin (MULTIVITAMIN WITH MINERALS) TABS tablet Take 1 tablet by mouth daily.   Yes Historical Provider, MD  oxyCODONE-acetaminophen (PERCOCET/ROXICET) 5-325 MG tablet Take 1 tablet by mouth every 4 (four) hours as needed for severe pain. 04/12/15  Yes Antony Blackbird, MD  polyethylene glycol (MIRALAX / GLYCOLAX) packet Take 17 g by mouth daily as needed for mild constipation or moderate constipation.   Yes Historical Provider, MD  rivaroxaban (XARELTO) 20 MG TABS tablet Take 1 tablet (20 mg total) by mouth daily with supper. 04/18/15   Renella Cunas, MD  XARELTO STARTER PACK 15 & 20 MG TBPK Take 15-20 mg by mouth as directed. Take as directed on package: Start with one 15mg  tablet by mouth twice a day with food. On Day 22, switch to one 20mg  tablet once a day with food. 04/12/15   Antony Blackbird, MD    Physical Exam: Filed Vitals:   10/02/15 2300 10/02/15 2330 10/03/15 0000 10/03/15 0038  BP: 159/92 133/81 131/78 145/85  Pulse: 108 104 105 108  Temp:    98.3  F (36.8 C)  TempSrc:    Oral  Resp: 20 15 17 18   Height:    5\' 4"  (1.626 m)  Weight:    81.239 kg (179 lb 1.6 oz)  SpO2: 99% 98% 92% 99%   General: Not in acute distress. Dry mucous and membrane. HEENT:       Eyes: PERRL, EOMI, no scleral icterus.       ENT: No discharge from the ears and nose, no pharynx injection, no tonsillar enlargement.        Neck: No JVD, no bruit, no mass felt. Heme: No neck lymph node enlargement. Cardiac: S1/S2, RRR, No murmurs, No gallops or rubs. Pulm: No rales, wheezing, rhonchi or rubs. Abd: Soft, nondistended. Tenderness over lower abdomen. There is a large and hard, non-moveble mass over right lower abdomen. No rebound pain, no organomegaly, BS present. GU: there is tenderness over left groin area. Ext: mild left leg edema. 2+DP/PT pulse  bilaterally. Musculoskeletal: No joint deformities, No joint redness or warmth, no limitation of ROM in spin. Skin: No rashes.  Neuro: Alert, oriented X3, cranial nerves II-XII grossly intact, moves all extremities normally. Psych: Patient is not psychotic, no suicidal or hemocidal ideation.  Labs on Admission:  Basic Metabolic Panel:  Recent Labs Lab 10/02/15 1550  NA 134*  K 4.3  CL 92*  CO2 29  GLUCOSE 110*  BUN 10  CREATININE 1.25*  CALCIUM 9.1   Liver Function Tests: No results for input(s): AST, ALT, ALKPHOS, BILITOT, PROT, ALBUMIN in the last 168 hours. No results for input(s): LIPASE, AMYLASE in the last 168 hours. No results for input(s): AMMONIA in the last 168 hours. CBC:  Recent Labs Lab 10/02/15 1550  WBC 5.3  NEUTROABS 3.6  HGB 8.5*  HCT 28.8*  MCV 70.8*  PLT 518*   Cardiac Enzymes: No results for input(s): CKTOTAL, CKMB, CKMBINDEX, TROPONINI in the last 168 hours.  BNP (last 3 results) No results for input(s): BNP in the last 8760 hours.  ProBNP (last 3 results) No results for input(s): PROBNP in the last 8760 hours.  CBG: No results for input(s): GLUCAP in the last 168 hours.  Radiological Exams on Admission: Ct Venogram Abd/pel  10/02/2015  CLINICAL DATA:  55 year old female with history of cervical cancer and peritoneal carcinomatosis. History of IVC filter. Increasing lower leg pain. Evaluate for thrombosis. EXAM: CT ABDOMEN AND PELVIS WITH CONTRAST TECHNIQUE: Multidetector CT imaging of the abdomen and pelvis was performed using the standard protocol following bolus administration of intravenous contrast. CONTRAST:  143mL ISOVUE-300 IOPAMIDOL (ISOVUE-300) INJECTION 61% COMPARISON:  CT the abdomen and pelvis 08/15/2010. FINDINGS: Lower chest:  Unremarkable. Hepatobiliary: 5.2 x 4.5 cm hypovascular mass in the posterior aspect of the right lobe of the liver involving both segments 6 in segments 7, presumably a metastatic lesion. No intra or  extrahepatic biliary ductal dilatation. Status post cholecystectomy. Pancreas: No pancreatic mass. No pancreatic ductal dilatation. Extensive peripancreatic fluid. Spleen: There are 2 hypovascular areas near the hilum of the spleen which persist on delayed phase imaging, suspicious for metastatic lesions, the largest of which measures up to 1.6 cm (image 24 of series 2). Adrenals/Urinary Tract: 11 mm simple cyst in the upper pole of the right kidney. Left kidney is normal in appearance. Mild left-sided hydroureteronephrosis. No right-sided hydroureteronephrosis. Urinary bladder appears mildly thickened cyst, but is partially decompressed. Bilateral adrenal glands are normal in appearance. Stomach/Bowel: The appearance of the stomach is normal. There is no pathologic dilatation of small bowel  or colon. Vascular/Lymphatic: IVC filter in position with tip terminating at the level of the renal veins. There does not appear to be thrombus within the IVC filter itself. However, there are long stents beneath the filter which extend from the external iliac veins bilaterally into the inferior vena cava, and both of these stents appear to be nearly (minimal flow in a short portion of the right stent) completely occluded. There is extensive collateralization around the stents communicating with the inferior vena cava at the level of the IVC filter, much of which appears to be coming from dilated right gonadal veins (multiple varices are noted). Additionally, there are numerous collaterals throughout the anterior abdominal wall. Mildly enlarged retroperitoneal lymph node in the left para-aortic nodal station measuring 1.2 cm in short axis. Soft tissue along the left pelvic side wall suspicious for left external iliac lymphadenopathy measuring up to 1.5 cm in diameter (image 69 of series 2), partially inseparable from the process in the left adnexal region. Reproductive: Uterus is again markedly enlarged and heterogeneous in  appearance, including what appears to be a large fibroid in the posterior aspect of the uterus measuring 6.0 x 9.2 x 9.2 cm. Large amount of fluid in the endometrial canal, measuring up to 3.2 cm in thickness in the fundus. Lower uterine segment and cervical region is grossly unremarkable in appearance. The ovaries are markedly enlarged bilaterally, measuring 8.7 x 8.8 x 8.7 cm on the right and 8.0 x 8.0 x 10.0 cm on the left. These ovaries are predominantly centrally low-attenuation, but demonstrate thick peripheral enhancement and some thick internal septations and nodularity, the overall appearance is most suggestive of neoplasm. Other: Moderate volume of ascites. Widespread nodularity throughout the peritoneal cavity, with peritoneal implants measuring up to 2.9 x 2.2 cm (image 51 of series 2) associated with the transverse colon. No pneumoperitoneum. Musculoskeletal: No aggressive appearing lytic or blastic lesions are noted in the visualized portions of the skeleton. IMPRESSION: 1. Although there is no clot associated with the patient's IVC filter, the long segment stents which extend from the external iliac veins into the IVC bilaterally are both thrombosed. Extensive venous collateralization bypasses this obstruction, as discussed above. 2. 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 In addition, there is some left pelvic sidewall and retroperitoneal lymphadenopathy, 2 suspicious splenic lesions, as well as a large hypovascular metastatic lesion in the right lobe of the liver, as discussed above. 3. Markedly enlarged ovaries bilaterally, the appearance of which is most typical for primary ovarian neoplasm. While this may relate seen the underlying metastatic disease to the ovaries, the development of a secondary neoplasm is not excluded, and clinical correlation is suggested. 4. Large amount of fluid in the endometrial canal, suggesting post treatment related cervical stenosis. 5.  Large lesion in the posterior aspect of the uterine fundus favored to represent a large fibroid, as above. 6. Additional incidental findings, as above. These results were called by telephone at the time of interpretation on 10/02/2015 at 10:03 pm to Dr. Donnald Garre, who verbally acknowledged these results. Electronically Signed   By: Vinnie Langton M.D.   On: 10/02/2015 22:10    Assessment/Plan Principal Problem:   DVT (deep venous thrombosis) (HCC) Active Problems:   Cervical cancer (HCC)   Asthma   Hypertension   Microcytic anemia   LBBB (left bundle branch block)   AKI (acute kidney injury) ([employer])   DVT (deep vein thrombosis) in pregnancy   Deep vein thrombosis (DVT) of iliac vein of  both lower extremities ([place])   Groin pain   DVT: EDP spoke with interventional radiology, Dr. Barbie Banner, will see pt in AM. Per EDP, IR is uncertain if pt is a candidate for lysis given her failed outpatient anticoagulation therapy.  -will admit to tele bed -When necessary Percocet and morphine for pain -Follow-up IR recommendations -switch Lovenox to IV heparin in case pt needs procedure -check preg test  Cervical cancer [facility]): s/p of chemotherapy and radiation therapy. Pt has been followed up by DR. Villella in [place]. Last seen was last week and was told to have liver metastases. CT-venogram today showed peritoneal carcinomatosis with malignant ascites, suspicious splenic lesions as well as a large hypovascular metastatic lesion in the right lobe of the liver. Patient also has markedly enlarged ovaries which is concerning for primary ovarian neoplasm in addition to her primary cervical neoplasm.  -Medical record is requested by EDP. -May consult to oncology in AM  Asthma: stable -prn albuterol nebs  Hypertension: -Hold HCTZ due to AKi -prn IV hydralazine  Microcytic anemia: Hemoglobin dropped from 12.8 on 04/12/15-->8.5. FOBT negative. No active bleeding.  -will get LFT, LDH and haptoglobin to rule  out hemolysis -Anemia panel  -cbc q6h  AKI: Likely due to prerenal secondary to dehydration and continuation of diruetics  - IVF: NS 125 cc/h - Check FeUrea - Follow up renal function by BMP - Hold HCTZ  LBBB (left bundle branch block): not sure if this is old or new issue. Pt dose not have any CP, unlikely to have ACS. -trop x 3 -repeat EKG in AM -f/u medical record to see if this is old issue.   DVT ppx: On IV heparin  Code Status: Full code Family Communication: Yes, patient's mother at bed side Disposition Plan: Admit to inpatient   Date of Service 10/03/2015    Ivor Costa [facility] Pager [phone]  If 7PM-7AM, please contact night-coverage [email or web address] Password [email or web address] 10/03/2015, 2:11 AM

## 2015-10-02 NOTE — ED Notes (Signed)
Stacy RN and Marcie Bal NT assisted with bedpan.

## 2015-10-02 NOTE — ED Provider Notes (Signed)
CSN: [id number]     Arrival date & time 10/02/15  1330 History   First MD Initiated Contact with Patient 10/02/15 1527     Chief Complaint  Patient presents with  . Leg Swelling     (Consider location/radiation/quality/duration/timing/severity/associated sxs/prior Treatment) HPI   Erica Huffman is a 55 year old female with a past medical history ofactive cervical cancer, DVTs in bilateral lower extremities to presents to the ED today complaining of increased pain and swelling  In left groin Over the last week. Patient also reports increased paresthesias in her left lower extremity as well as swelling in this leg.Patient has been taking Lovenox injections, last dose was today, and has been elevating her legs without relief.Patientdenieschest pain or shortness of breath. No history of pulmonary embolism.  Pertinent past medical history course in brief:  Patient has active cervical cancer for which she completed chemotherapy and radiation one year ago. The cervical cancer has returned and she is not actively being treated for this. In 04/2015 patient was diagnosed with a DVT in her left popliteal region. She failed outpatient therapy on Xarelto as well as Coumadin. She had stent placed in her popliteal region which then failed.The DVT was found in [place] for the remainder of her treatment occurred in [place]. She just returned to in to see one week ago. After the stent failed, patient as hospitalized for 2 months in [place] in 06/2015 andunderwent procedure again and had IVC filter placed as well as venoplasty of left external iliac, common femoral and a central deep femoral vein. Per patient she has stent placed in both, femoral veins. Patient has trouble with intermittent pain and swelling in her lower extremities despite outpatient anticoagulation. Patient states that she was supposed to undergo a procedure last week to help open up her stents, but was unable to and she was moving back  to [place]. She has not established care with a vascular surgeon in this area yet. She did have all of her medical records shipped to an oncologist in this area, but is unsure of which one.She has not been seen by physician since she has been [place].atient states that she has beengoing through physical therapy and rehabilitation over the last monthin order to learn how to walk again. Patient states she is able to ambulate with a walker.    Per office visit note from [facility]:    Interval History: Erica Huffman is an 55 y.o. female PMHx cervical CA (chemo/radiation completed 3.2016), chronic DVT of R calf, and Left iliofemoral DVT. She has been on both Coumadin and Xarelto on and off with questionable compliance. Per patient, she had been taking Coumadin for past month up until her [facility] admission 3 days ago.   The left DVT was treated at an OSH 05/2015 with lysis and iliac vein stenting. The stent occluded shortly after and was unable to recanalize. She presented to [facility] on 12/21 with progressive left leg pain and swelling, started on a heparin gtt.   A venogram performed there revealed iliocaval occlusion from the infrarenal IVC filter down to the left popliteal veins. The attempt at pharmacomechanical thrombectomy and lysis was cut short and the patient was transferred for further management.   Patient was seen bedside and is resting comfortably. She complains of chronic LLE pain and swelling but denies any sensorimotor deficit. No fever, chest pain, or SOB.   05/28/15 IVC filter placement   05/29/15 diagnostic venogram   06/29/15  LLE Lysis - [facility]   07/04/15 LLE Lysis - [facility]  09/24/15 Pt returns today for office hours. 09/13/15 u/s revealing rethrombosis of stents with new B CFV thrombus. She's still on lovenox 100 bid at this time.   Past Medical History  Diagnosis Date  . Cancer ([place])   . Cervical cancer ([place])   . Asthma   . Hypertension    Past Surgical  History  Procedure Laterality Date  . Ectopic pregnancy surgery    . Cholecystectomy    . Fracture surgery     Family History  Problem Relation Age of Onset  . Hypertension Mother    Social History  Substance Use Topics  . Smoking status: Former Research scientist (life sciences)  . Smokeless tobacco: None  . Alcohol Use: No   OB History    No data available     Review of Systems  All other systems reviewed and are negative.     Allergies  Shellfish allergy  Home Medications   Prior to Admission medications   Medication Sig Start Date End Date Taking? Authorizing Provider  acetaminophen (TYLENOL) 325 MG tablet Take 325 mg by mouth every 4 (four) hours as needed for moderate pain.    Yes Historical Provider, MD  albuterol (PROVENTIL HFA;VENTOLIN HFA) 108 (90 BASE) MCG/ACT inhaler Inhale 2 puffs into the lungs every 6 (six) hours as needed for wheezing or shortness of breath.   Yes Historical Provider, MD  cyclobenzaprine (FLEXERIL) 10 MG tablet Take 10 mg by mouth 2 (two) times daily.   Yes Historical Provider, MD  docusate sodium (COLACE) 100 MG capsule Take 100 mg by mouth 2 (two) times daily.   Yes Historical Provider, MD  enoxaparin (LOVENOX) 100 MG/ML injection Inject 100 mg into the skin every 12 (twelve) hours.   Yes Historical Provider, MD  guaiFENesin (ROBITUSSIN) 100 MG/5ML liquid Take 200 mg by mouth 2 (two) times daily as needed for cough.   Yes Historical Provider, MD  hydrochlorothiazide (HYDRODIURIL) 25 MG tablet Take 1 tablet (25 mg total) by mouth daily. 08/16/15  Yes Tresa Garter, MD  Multiple Vitamin (MULTIVITAMIN WITH MINERALS) TABS tablet Take 1 tablet by mouth daily.   Yes Historical Provider, MD  oxyCODONE-acetaminophen (PERCOCET/ROXICET) 5-325 MG tablet Take 1 tablet by mouth every 4 (four) hours as needed for severe pain. 04/12/15  Yes Antony Blackbird, MD  polyethylene glycol (MIRALAX / GLYCOLAX) packet Take 17 g by mouth daily as needed for mild constipation or moderate  constipation.   Yes Historical Provider, MD  rivaroxaban (XARELTO) 20 MG TABS tablet Take 1 tablet (20 mg total) by mouth daily with supper. 04/18/15   Renella Cunas, MD  XARELTO STARTER PACK 15 & 20 MG TBPK Take 15-20 mg by mouth as directed. Take as directed on package: Start with one 15mg  tablet by mouth twice a day with food. On Day 22, switch to one 20mg  tablet once a day with food. 04/12/15   Gerald Stabs Tegeler, MD   BP 143/101 mmHg  Pulse 105  Temp(Src) 98.1 F (36.7 C) (Oral)  Resp 20  SpO2 98% Physical Exam  Constitutional: She is oriented to person, place, and time. She appears well-developed and well-nourished. No distress.  HENT:  Head: Normocephalic and atraumatic.  Mouth/Throat: No oropharyngeal exudate.  Eyes: Conjunctivae and EOM are normal. Pupils are equal, round, and reactive to light. Right eye exhibits no discharge. Left eye exhibits no discharge. No scleral icterus.  Cardiovascular: Normal rate, regular rhythm, normal heart sounds and  intact distal pulses.  Exam reveals no gallop and no friction rub.   No murmur heard. Pulmonary/Chest: Effort normal and breath sounds normal. No respiratory distress. She has no wheezes. She has no rales. She exhibits no tenderness.  Abdominal: Soft. Bowel sounds are normal. She exhibits ascites. She exhibits no distension. There is tenderness. There is no guarding and no CVA tenderness.    Musculoskeletal: Normal range of motion. She exhibits edema ( non pitting LLE).  Neurological: She is alert and oriented to person, place, and time.  Strength 5/5 throughout. No sensory deficits.    Skin: Skin is warm and dry. No rash noted. She is not diaphoretic. No erythema. No pallor.  Psychiatric: She has a normal mood and affect. Her behavior is normal.  Nursing note and vitals reviewed.   ED Course  Procedures (including critical care time) Labs Review Labs Reviewed  BASIC METABOLIC PANEL - Abnormal; Notable for the following:    Sodium 134  (*)    Chloride 92 (*)    Glucose, Bld 110 (*)    Creatinine, Ser 1.25 (*)    GFR calc non Af Amer 48 (*)    GFR calc Af Amer 56 (*)    All other components within normal limits  CBC WITH DIFFERENTIAL/PLATELET - Abnormal; Notable for the following:    Hemoglobin 8.5 (*)    HCT 28.8 (*)    MCV 70.8 (*)    MCH 20.9 (*)    MCHC 29.5 (*)    RDW 20.4 (*)    Platelets 518 (*)    All other components within normal limits  PROTIME-INR - Abnormal; Notable for the following:    Prothrombin Time 16.4 (*)    All other components within normal limits  POC OCCULT BLOOD, ED    Imaging Review Ct Venogram Abd/pel  10/02/2015  CLINICAL DATA:  55 year old female with history of cervical cancer and peritoneal carcinomatosis. History of IVC filter. Increasing lower leg pain. Evaluate for thrombosis. EXAM: CT ABDOMEN AND PELVIS WITH CONTRAST TECHNIQUE: Multidetector CT imaging of the abdomen and pelvis was performed using the standard protocol following bolus administration of intravenous contrast. CONTRAST:  137mL ISOVUE-300 IOPAMIDOL (ISOVUE-300) INJECTION 61% COMPARISON:  CT the abdomen and pelvis 08/15/2010. FINDINGS: Lower chest:  Unremarkable. Hepatobiliary: 5.2 x 4.5 cm hypovascular mass in the posterior aspect of the right lobe of the liver involving both segments 6 in segments 7, presumably a metastatic lesion. No intra or extrahepatic biliary ductal dilatation. Status post cholecystectomy. Pancreas: No pancreatic mass. No pancreatic ductal dilatation. Extensive peripancreatic fluid. Spleen: There are 2 hypovascular areas near the hilum of the spleen which persist on delayed phase imaging, suspicious for metastatic lesions, the largest of which measures up to 1.6 cm (image 24 of series 2). Adrenals/Urinary Tract: 11 mm simple cyst in the upper pole of the right kidney. Left kidney is normal in appearance. Mild left-sided hydroureteronephrosis. No right-sided hydroureteronephrosis. Urinary bladder appears  mildly thickened cyst, but is partially decompressed. Bilateral adrenal glands are normal in appearance. Stomach/Bowel: The appearance of the stomach is normal. There is no pathologic dilatation of small bowel or colon. Vascular/Lymphatic: IVC filter in position with tip terminating at the level of the renal veins. There does not appear to be thrombus within the IVC filter itself. However, there are long stents beneath the filter which extend from the external iliac veins bilaterally into the inferior vena cava, and both of these stents appear to be nearly (minimal flow in a short  portion of the right stent) completely occluded. There is extensive collateralization around the stents communicating with the inferior vena cava at the level of the IVC filter, much of which appears to be coming from dilated right gonadal veins (multiple varices are noted). Additionally, there are numerous collaterals throughout the anterior abdominal wall. Mildly enlarged retroperitoneal lymph node in the left para-aortic nodal station measuring 1.2 cm in short axis. Soft tissue along the left pelvic side wall suspicious for left external iliac lymphadenopathy measuring up to 1.5 cm in diameter (image 69 of series 2), partially inseparable from the process in the left adnexal region. Reproductive: Uterus is again markedly enlarged and heterogeneous in appearance, including what appears to be a large fibroid in the posterior aspect of the uterus measuring 6.0 x 9.2 x 9.2 cm. Large amount of fluid in the endometrial canal, measuring up to 3.2 cm in thickness in the fundus. Lower uterine segment and cervical region is grossly unremarkable in appearance. The ovaries are markedly enlarged bilaterally, measuring 8.7 x 8.8 x 8.7 cm on the right and 8.0 x 8.0 x 10.0 cm on the left. These ovaries are predominantly centrally low-attenuation, but demonstrate thick peripheral enhancement and some thick internal septations and nodularity, the overall  appearance is most suggestive of neoplasm. Other: Moderate volume of ascites. Widespread nodularity throughout the peritoneal cavity, with peritoneal implants measuring up to 2.9 x 2.2 cm (image 51 of series 2) associated with the transverse colon. No pneumoperitoneum. Musculoskeletal: No aggressive appearing lytic or blastic lesions are noted in the visualized portions of the skeleton. IMPRESSION: 1. Although there is no clot associated with the patient's IVC filter, the long segment stents which extend from the external iliac veins into the IVC bilaterally are both thrombosed. Extensive venous collateralization bypasses this obstruction, as discussed above. 2. 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 In addition, there is some left pelvic sidewall and retroperitoneal lymphadenopathy, 2 suspicious splenic lesions, as well as a large hypovascular metastatic lesion in the right lobe of the liver, as discussed above. 3. Markedly enlarged ovaries bilaterally, the appearance of which is most typical for primary ovarian neoplasm. While this may relate seen the underlying metastatic disease to the ovaries, the development of a secondary neoplasm is not excluded, and clinical correlation is suggested. 4. Large amount of fluid in the endometrial canal, suggesting post treatment related cervical stenosis. 5. Large lesion in the posterior aspect of the uterine fundus favored to represent a large fibroid, as above. 6. Additional incidental findings, as above. These results were called by telephone at the time of interpretation on 10/02/2015 at 10:03 pm to Dr. Donnald Garre, who verbally acknowledged these results. Electronically Signed   By: Vinnie Langton M.D.   On: 10/02/2015 22:10   I have personally reviewed and evaluated these images and lab results as part of my medical decision-making.   EKG Interpretation   Date/Time:  Tuesday October 02 2015 16:02:15 EDT Ventricular Rate:  103 PR  Interval:  154 QRS Duration: 137 QT Interval:  389 QTC Calculation: 509 R Axis:   17 Text Interpretation:  Sinus tachycardia Left bundle branch block No old  tracing to compare Confirmed by [facility]  MD, Nunzio Cory [phone]) on 10/02/2015  4:37:27 PM      MDM   Final diagnoses:  Groin pain  Deep vein thrombosis (DVT) of iliac vein of both lower extremities, unspecified chronicity ([place])    55 y.o F with hx of cervical Ca, chronic DVTs with multiple  stent placements. See HPI for further details. Presenting for increased groin pain and swelling. Pt is currently on lovenox 100mg  BID, last dose today. No hx of PE. No CP or SOB. I spoke with interventional radiology who recommends CT venogram to further evaluate IVC filter and iliac veins.   CT venogram reveals patent IVC however, the long segment stents which extend from the external iliac veins into the IVC bilaterally are thrombosed. There is extensive venous collateralization which bypasses this obstruction. Patient also has peritoneal carcinomatosis with malignant ascites, 2 suspicious splenic lesions as well as a large hypovascular metastatic lesion in the right lobe of the liver. Patient also has markedly enlarged ovaries which is concerning for primary ovarian neoplasm in addition to her primary cervical neoplasm.   Patient's hemoglobin is 8.5, which has dropped over 4 points since 04/2015. Patient is Hemoccult negative. Unknown source of anemia at this time. EKG reveals left bundle branch block. No old tracing to compare, uncertain if this is new or old. He is not having any active chest pain or shortness of breath. Do not suspect PE at this time. Her IVC filter is patent. Spoke with hospitalist who will admit patient due to her new anemia and thrombosed iliac veins. Patient is anticoagulated with Lovenox at home and has failed outpatient Xarelto and Coumadin. I spoke with interventional radiology as well they will consult patient in the morning.  They are uncertain if she is a candidate for lysis given her failed outpatient anticoagulation therapy. Patient's pain was managed in the ED. She is currently hemodynamically stable and awaiting bed placement.  Case discussed with Dr. Thurnell Garbe who agrees with treatment plan.      [place], PA-C 10/03/15 [place], DO 10/05/15 [phone]

## 2015-10-02 NOTE — ED Notes (Signed)
Pt in CT.

## 2015-10-02 NOTE — ED Notes (Signed)
Assisted patient with bedpan. Pt was able to urinate and personal hygiene performed.

## 2015-10-02 NOTE — Progress Notes (Addendum)
Pt noted with out of state medicaid and no pcp CM spoke with female visitor at bedside to confirm pt has medicaid of [place] and had returned to [place] after seeing Dr Harold Hedge for f/u care the last [facility] visit  Pt returned from imaging to her room she confirms she still has medicaid of [place] and "plans to remain in [place] for a while" Pt informed CM that Dr Jenell Milliner "retired" and the last time she saw him was "in November"    Pt given uninsured [employer] and a list of [place] accepting providers    CM discussed and provided written information for uninsured accepting pcps, discussed the importance of pcp vs EDP services for f/u care, www.needymeds.org, www.goodrx.com, discounted pharmacies and other [employer] such as [employer] [facility] [employer], affordable care act, financial assistance, uninsured dental services, [place] med assist, DSS and  health department  Reviewed resources for [employer] uninsured accepting pcps like [facility], family medicine at [employer], [facility], [facility], local urgent care centers, [facility], [facility] family practice, general medical clinics, family services of the [place], [facility] urgent care plus others, medication resources, [facility] out patient pharmacies and housing Pt voiced understanding and appreciation of resources provided   Provided [facility] contact information   Discussed with Medicaid of [place] vs [place] uninsured status  Discussed that pending ED evaluation may need further assist and will be seen by ED PM CM

## 2015-10-02 NOTE — Progress Notes (Signed)
EDCM spoke to patient at bedside.  Disposition still unknown.  Patient is agreeable to have [facility] email [facility] in attempts to obtain appointment to establish primary care and cardiology to establish care.  Patient reports she has refills for her lovenox for another 4 months ordered by her doctor in [place].  Patient takes lovenox 100mg  bid.  No further EDCM needs at this time.

## 2015-10-03 ENCOUNTER — Encounter (HOSPITAL_COMMUNITY): Payer: Self-pay | Admitting: Internal Medicine

## 2015-10-03 DIAGNOSIS — I447 Left bundle-branch block, unspecified: Secondary | ICD-10-CM

## 2015-10-03 DIAGNOSIS — R000 Tachycardia, unspecified: Secondary | ICD-10-CM | POA: Diagnosis present

## 2015-10-03 DIAGNOSIS — C539 Malignant neoplasm of cervix uteri, unspecified: Secondary | ICD-10-CM

## 2015-10-03 DIAGNOSIS — Z9049 Acquired absence of other specified parts of digestive tract: Secondary | ICD-10-CM | POA: Diagnosis not present

## 2015-10-03 DIAGNOSIS — I82523 Chronic embolism and thrombosis of iliac vein, bilateral: Secondary | ICD-10-CM | POA: Diagnosis not present

## 2015-10-03 DIAGNOSIS — I82402 Acute embolism and thrombosis of unspecified deep veins of left lower extremity: Secondary | ICD-10-CM

## 2015-10-03 DIAGNOSIS — I10 Essential (primary) hypertension: Secondary | ICD-10-CM | POA: Diagnosis present

## 2015-10-03 DIAGNOSIS — Z87891 Personal history of nicotine dependence: Secondary | ICD-10-CM | POA: Diagnosis not present

## 2015-10-03 DIAGNOSIS — C7801 Secondary malignant neoplasm of right lung: Secondary | ICD-10-CM | POA: Diagnosis not present

## 2015-10-03 DIAGNOSIS — R1030 Lower abdominal pain, unspecified: Secondary | ICD-10-CM

## 2015-10-03 DIAGNOSIS — R180 Malignant ascites: Secondary | ICD-10-CM | POA: Diagnosis not present

## 2015-10-03 DIAGNOSIS — C786 Secondary malignant neoplasm of retroperitoneum and peritoneum: Secondary | ICD-10-CM | POA: Diagnosis present

## 2015-10-03 DIAGNOSIS — Z8249 Family history of ischemic heart disease and other diseases of the circulatory system: Secondary | ICD-10-CM | POA: Diagnosis not present

## 2015-10-03 DIAGNOSIS — E860 Dehydration: Secondary | ICD-10-CM | POA: Diagnosis present

## 2015-10-03 DIAGNOSIS — I82423 Acute embolism and thrombosis of iliac vein, bilateral: Secondary | ICD-10-CM | POA: Insufficient documentation

## 2015-10-03 DIAGNOSIS — N179 Acute kidney failure, unspecified: Secondary | ICD-10-CM

## 2015-10-03 DIAGNOSIS — O2230 Deep phlebothrombosis in pregnancy, unspecified trimester: Secondary | ICD-10-CM | POA: Diagnosis present

## 2015-10-03 DIAGNOSIS — C787 Secondary malignant neoplasm of liver and intrahepatic bile duct: Secondary | ICD-10-CM | POA: Diagnosis present

## 2015-10-03 DIAGNOSIS — J45909 Unspecified asthma, uncomplicated: Secondary | ICD-10-CM | POA: Diagnosis present

## 2015-10-03 DIAGNOSIS — Z923 Personal history of irradiation: Secondary | ICD-10-CM | POA: Diagnosis not present

## 2015-10-03 DIAGNOSIS — D509 Iron deficiency anemia, unspecified: Secondary | ICD-10-CM

## 2015-10-03 DIAGNOSIS — D259 Leiomyoma of uterus, unspecified: Secondary | ICD-10-CM | POA: Diagnosis present

## 2015-10-03 DIAGNOSIS — Z91013 Allergy to seafood: Secondary | ICD-10-CM | POA: Diagnosis not present

## 2015-10-03 DIAGNOSIS — Z79899 Other long term (current) drug therapy: Secondary | ICD-10-CM | POA: Diagnosis not present

## 2015-10-03 DIAGNOSIS — Z7901 Long term (current) use of anticoagulants: Secondary | ICD-10-CM | POA: Diagnosis not present

## 2015-10-03 DIAGNOSIS — Z9221 Personal history of antineoplastic chemotherapy: Secondary | ICD-10-CM | POA: Diagnosis not present

## 2015-10-03 DIAGNOSIS — D7389 Other diseases of spleen: Secondary | ICD-10-CM | POA: Diagnosis present

## 2015-10-03 LAB — CBC
HCT: 28.9 % — ABNORMAL LOW (ref 36.0–46.0)
HCT: 27.3 % — ABNORMAL LOW (ref 36.0–46.0)
HCT: 26.8 % — ABNORMAL LOW (ref 36.0–46.0)
HEMATOCRIT: 27.9 % — AB (ref 36.0–46.0)
HEMOGLOBIN: 8.2 g/dL — AB (ref 12.0–15.0)
HEMOGLOBIN: 8.1 g/dL — AB (ref 12.0–15.0)
HEMOGLOBIN: 8.3 g/dL — AB (ref 12.0–15.0)
Hemoglobin: 8.6 g/dL — ABNORMAL LOW (ref 12.0–15.0)
MCH: 21 pg — ABNORMAL LOW (ref 26.0–34.0)
MCH: 21.4 pg — ABNORMAL LOW (ref 26.0–34.0)
MCH: 21.6 pg — ABNORMAL LOW (ref 26.0–34.0)
MCH: 21.1 pg — ABNORMAL LOW (ref 26.0–34.0)
MCHC: 29.8 g/dL — ABNORMAL LOW (ref 30.0–36.0)
MCHC: 30 g/dL (ref 30.0–36.0)
MCHC: 30.2 g/dL (ref 30.0–36.0)
MCHC: 29.7 g/dL — AB (ref 30.0–36.0)
MCV: 70.5 fL — AB (ref 78.0–100.0)
MCV: 71.1 fL — ABNORMAL LOW (ref 78.0–100.0)
MCV: 71.5 fL — AB (ref 78.0–100.0)
MCV: 70.8 fL — ABNORMAL LOW (ref 78.0–100.0)
PLATELETS: 485 10*3/uL — AB (ref 150–400)
PLATELETS: 418 10*3/uL — AB (ref 150–400)
Platelets: 443 10*3/uL — ABNORMAL HIGH (ref 150–400)
Platelets: 429 10*3/uL — ABNORMAL HIGH (ref 150–400)
RBC: 4.1 MIL/uL (ref 3.87–5.11)
RBC: 3.84 MIL/uL — ABNORMAL LOW (ref 3.87–5.11)
RBC: 3.75 MIL/uL — AB (ref 3.87–5.11)
RBC: 3.94 MIL/uL (ref 3.87–5.11)
RDW: 20.2 % — AB (ref 11.5–15.5)
RDW: 20.3 % — ABNORMAL HIGH (ref 11.5–15.5)
RDW: 20.3 % — ABNORMAL HIGH (ref 11.5–15.5)
RDW: 20.1 % — ABNORMAL HIGH (ref 11.5–15.5)
WBC: 5.1 10*3/uL (ref 4.0–10.5)
WBC: 5.4 10*3/uL (ref 4.0–10.5)
WBC: 5.2 10*3/uL (ref 4.0–10.5)
WBC: 6 10*3/uL (ref 4.0–10.5)

## 2015-10-03 LAB — BASIC METABOLIC PANEL
Anion gap: 11 (ref 5–15)
BUN: 9 mg/dL (ref 6–20)
CHLORIDE: 92 mmol/L — AB (ref 101–111)
CO2: 28 mmol/L (ref 22–32)
CREATININE: 1.27 mg/dL — AB (ref 0.44–1.00)
Calcium: 8.9 mg/dL (ref 8.9–10.3)
GFR calc Af Amer: 55 mL/min — ABNORMAL LOW (ref >60)
GFR calc non Af Amer: 47 mL/min — ABNORMAL LOW (ref >60)
Glucose, Bld: 119 mg/dL — ABNORMAL HIGH (ref 65–99)
Potassium: 3.1 mmol/L — ABNORMAL LOW (ref 3.5–5.1)
Sodium: 131 mmol/L — ABNORMAL LOW (ref 135–145)

## 2015-10-03 LAB — TROPONIN I
Troponin I: <0.03 ng/mL (ref <0.031)
Troponin I: <0.03 ng/mL (ref <0.031)

## 2015-10-03 LAB — HEPARIN LEVEL (UNFRACTIONATED): Heparin Unfractionated: 1.44 IU/mL — ABNORMAL HIGH (ref 0.30–0.70)

## 2015-10-03 LAB — HEPATIC FUNCTION PANEL
ALBUMIN: 2.9 g/dL — AB (ref 3.5–5.0)
ALK PHOS: 88 U/L (ref 38–126)
ALT: 43 U/L (ref 14–54)
AST: 15 U/L (ref 15–41)
Bilirubin, Direct: <0.1 mg/dL — ABNORMAL LOW (ref 0.1–0.5)
TOTAL PROTEIN: 7.5 g/dL (ref 6.5–8.1)
Total Bilirubin: 0.5 mg/dL (ref 0.3–1.2)

## 2015-10-03 LAB — TYPE AND SCREEN
ABO/RH(D): B POS
Antibody Screen: NEGATIVE

## 2015-10-03 LAB — CREATININE, URINE, RANDOM: Creatinine, Urine: 107.91 mg/dL

## 2015-10-03 LAB — HCG, QUANTITATIVE, PREGNANCY: HCG, BETA CHAIN, QUANT, S: 8 m[IU]/mL — AB (ref <5)

## 2015-10-03 LAB — LACTATE DEHYDROGENASE: LDH: 163 U/L (ref 98–192)

## 2015-10-03 LAB — GLUCOSE, CAPILLARY: Glucose-Capillary: 112 mg/dL — ABNORMAL HIGH (ref 65–99)

## 2015-10-03 LAB — APTT: aPTT: 51 seconds — ABNORMAL HIGH (ref 24–37)

## 2015-10-03 LAB — ABO/RH: ABO/RH(D): B POS

## 2015-10-03 MED ORDER — POTASSIUM CHLORIDE 20 MEQ/15ML (10%) PO SOLN
40.0000 meq | Freq: Once | ORAL | Status: AC
  Administered 2015-10-03: 40 meq via ORAL
  Filled 2015-10-03: qty 30

## 2015-10-03 MED ORDER — ADULT MULTIVITAMIN W/MINERALS CH
1.0000 | ORAL_TABLET | Freq: Every day | ORAL | Status: DC
  Administered 2015-10-03 – 2015-10-04 (×2): 1 via ORAL
  Filled 2015-10-03 (×2): qty 1

## 2015-10-03 MED ORDER — MORPHINE SULFATE (PF) 2 MG/ML IV SOLN
2.0000 mg | INTRAVENOUS | Status: DC | PRN
  Administered 2015-10-03 – 2015-10-04 (×3): 2 mg via INTRAVENOUS
  Filled 2015-10-03 (×3): qty 1

## 2015-10-03 MED ORDER — DOCUSATE SODIUM 100 MG PO CAPS
100.0000 mg | ORAL_CAPSULE | Freq: Two times a day (BID) | ORAL | Status: DC
  Administered 2015-10-03 – 2015-10-04 (×4): 100 mg via ORAL
  Filled 2015-10-03 (×4): qty 1

## 2015-10-03 MED ORDER — HYDRALAZINE HCL 20 MG/ML IJ SOLN: 5.0000 mg | INTRAMUSCULAR | Status: DC | PRN

## 2015-10-03 MED ORDER — OXYCODONE-ACETAMINOPHEN 5-325 MG PO TABS
2.0000 | ORAL_TABLET | ORAL | Status: DC | PRN
Start: 2015-10-03 — End: 2015-10-04
  Administered 2015-10-03 (×2): 2 via ORAL
  Filled 2015-10-03 (×3): qty 2

## 2015-10-03 MED ORDER — SODIUM CHLORIDE 0.9 % IV SOLN
INTRAVENOUS | Status: DC
  Administered 2015-10-03 – 2015-10-04 (×4): via INTRAVENOUS

## 2015-10-03 MED ORDER — ENOXAPARIN SODIUM 80 MG/0.8ML ~~LOC~~ SOLN
80.0000 mg | Freq: Two times a day (BID) | SUBCUTANEOUS | Status: DC
  Administered 2015-10-03 – 2015-10-04 (×3): 80 mg via SUBCUTANEOUS
  Filled 2015-10-03 (×3): qty 0.8

## 2015-10-03 MED ORDER — ACETAMINOPHEN 325 MG PO TABS: 325.0000 mg | ORAL_TABLET | ORAL | Status: DC | PRN

## 2015-10-03 MED ORDER — GUAIFENESIN 100 MG/5ML PO SOLN
200.0000 mg | Freq: Two times a day (BID) | ORAL | Status: DC | PRN
Start: 2015-10-03 — End: 2015-10-04

## 2015-10-03 MED ORDER — CYCLOBENZAPRINE HCL 10 MG PO TABS
10.0000 mg | ORAL_TABLET | Freq: Two times a day (BID) | ORAL | Status: DC
  Administered 2015-10-03 – 2015-10-04 (×4): 10 mg via ORAL
  Filled 2015-10-03 (×4): qty 1

## 2015-10-03 MED ORDER — POLYETHYLENE GLYCOL 3350 17 G PO PACK
17.0000 g | PACK | Freq: Every day | ORAL | Status: DC | PRN
  Administered 2015-10-03 – 2015-10-04 (×2): 17 g via ORAL
  Filled 2015-10-03 (×2): qty 1

## 2015-10-03 MED ORDER — SODIUM CHLORIDE 0.9% FLUSH
3.0000 mL | Freq: Two times a day (BID) | INTRAVENOUS | Status: DC
  Administered 2015-10-03: 3 mL via INTRAVENOUS

## 2015-10-03 MED ORDER — HEPARIN (PORCINE) IN NACL 100-0.45 UNIT/ML-% IJ SOLN
1200.0000 [IU]/h | INTRAMUSCULAR | Status: DC
  Administered 2015-10-03: 1200 [IU]/h via INTRAVENOUS
  Filled 2015-10-03: qty 250

## 2015-10-03 MED ORDER — OXYCODONE-ACETAMINOPHEN 5-325 MG PO TABS: 1.0000 | ORAL_TABLET | ORAL | Status: DC | PRN

## 2015-10-03 MED ORDER — ALBUTEROL SULFATE (2.5 MG/3ML) 0.083% IN NEBU: 2.5000 mg | INHALATION_SOLUTION | RESPIRATORY_TRACT | Status: DC | PRN

## 2015-10-03 NOTE — Hospital Discharge Follow-Up (Signed)
[facility] [place]:  This Case Manager received communication from Livia Snellen, RN CM that patient needing hospital follow-up appointment. Patient admitted for DVT with a thrombosed IVC filter. On Lovenox prior to admission.  Patient recently moved from [place] to [place], [place] and has out of state Medicaid. Patient does not have a local PCP.    This Case Manager met with patient at bedside.  Informed patient of the [place]. Patient appreciative of information and indicated she would like to follow-up at the [facility] and [facility] after discharge.  Appointment scheduled for 10/12/15 at 1430 with Dr. Jarold Song. AVS updated. Discussed importance of going to Department of Social Services and applying for [facility]. Patient verbalized understanding and indicated she plans to apply for Medicaid as soon as possible. Patient indicated she has supply of Lovenox at home.  Patient indicated she plans to pay out of pocket for recommended DME-tub bench, shower chair, 3 in 1 bedside commode. Dessa Phi, RN CM updated. Will continue to follow patient's clinical progress closely.

## 2015-10-03 NOTE — Progress Notes (Addendum)
[facility] PROGRESS NOTE    Progress Note   Erica Huffman [id number] DOB: 1961-03-14 DOA: 10/02/2015 PCP: Pcp Not In System   Brief Narrative:   Erica Huffman is an 55 y.o. 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 In the ED she was found to have a hemoglobin of 8.5, which is a drop from 12.8 on 04/12/2015. A CT venogram was done that revealed patent IVC's filter, but the long segment which extends from the external iliac vein into the IVC bilateral are thrombosed. There is. She has significant peritoneal carcinomatosis with malignant ascites, suspicious splenic lesion with a large hypovascular metastatic lesion in the right lower lobe.  Assessment/Plan:   DVT (deep venous thrombosis) (HCC) with  IVC filter:  With a CT venogram That showed  extensive venous collateralization which bypassing this obstruction. She was started on narcotics for pain, IR was consulted for possible direct thrombolytic therapy, they relate there is no need for surgical intervention at this point. She was switched from Lovenox to IV heparin.  Cervical cancer ([place]) Status post chemotherapy and radiation therapy. She has been told he has liver metastases, her CT venogram showed extensive peritoneal carcinomatosis with malignant ascites. There was enlarged ovaries which was concerning for primary ovarian neoplasm and cervical neoplasm. [facility] records were just requested by EDP.  Asthma: Continue when necessary albuterol.  Essential  Hypertension: Continue hold antihypertensive medication. Continue hydralazine IV when necessary.  Microcytic anemia: Check anemia panel. There is a marked off in hemoglobin from a year ago. Signs of overt bleeding.  Left bundle-branch block: She denies any chest pain or  shortness of breath. Cardiac biomarkers are negative 3.  DVT Prophylaxis - Lovenox ordered.  Family Communication: mother Disposition Plan: Home in 1-2 days Code Status:     Code Status Orders        Start     Ordered   10/03/15 0121  Full code   Continuous     10/03/15 0121    Code Status History    Date Active Date Inactive Code Status Order ID Comments User Context   This patient has a current code status but no historical code status.        IV Access:    Peripheral IV   Procedures and diagnostic studies:   Ct Venogram Abd/pel  10/02/2015  CLINICAL DATA:  55 year old female with history of cervical cancer and peritoneal carcinomatosis. History of IVC filter. Increasing lower leg pain. Evaluate for thrombosis. EXAM: CT ABDOMEN AND PELVIS WITH CONTRAST TECHNIQUE: Multidetector CT imaging of the abdomen and pelvis was performed using the standard protocol following bolus administration of intravenous contrast. CONTRAST:  115mL ISOVUE-300 IOPAMIDOL (ISOVUE-300) INJECTION 61% COMPARISON:  CT the abdomen and pelvis 08/15/2010. FINDINGS: Lower chest:  Unremarkable. Hepatobiliary: 5.2 x 4.5 cm hypovascular mass in the posterior aspect of the right lobe of the liver involving both segments 6 in segments 7, presumably a metastatic lesion. No intra or extrahepatic biliary ductal dilatation. Status post cholecystectomy. Pancreas: No pancreatic mass. No pancreatic ductal dilatation. Extensive peripancreatic fluid. Spleen: There are 2 hypovascular areas near the hilum of the spleen which persist on delayed phase imaging, suspicious for metastatic lesions, the largest of which measures up to 1.6 cm (image 24 of series 2). Adrenals/Urinary Tract: 11 mm simple cyst in the upper pole of  the right kidney. Left kidney is normal in appearance. Mild left-sided hydroureteronephrosis. No right-sided hydroureteronephrosis. Urinary bladder appears mildly thickened cyst, but is partially decompressed.  Bilateral adrenal glands are normal in appearance. Stomach/Bowel: The appearance of the stomach is normal. There is no pathologic dilatation of small bowel or colon. Vascular/Lymphatic: IVC filter in position with tip terminating at the level of the renal veins. There does not appear to be thrombus within the IVC filter itself. However, there are long stents beneath the filter which extend from the external iliac veins bilaterally into the inferior vena cava, and both of these stents appear to be nearly (minimal flow in a short portion of the right stent) completely occluded. There is extensive collateralization around the stents communicating with the inferior vena cava at the level of the IVC filter, much of which appears to be coming from dilated right gonadal veins (multiple varices are noted). Additionally, there are numerous collaterals throughout the anterior abdominal wall. Mildly enlarged retroperitoneal lymph node in the left para-aortic nodal station measuring 1.2 cm in short axis. Soft tissue along the left pelvic side wall suspicious for left external iliac lymphadenopathy measuring up to 1.5 cm in diameter (image 69 of series 2), partially inseparable from the process in the left adnexal region. Reproductive: Uterus is again markedly enlarged and heterogeneous in appearance, including what appears to be a large fibroid in the posterior aspect of the uterus measuring 6.0 x 9.2 x 9.2 cm. Large amount of fluid in the endometrial canal, measuring up to 3.2 cm in thickness in the fundus. Lower uterine segment and cervical region is grossly unremarkable in appearance. The ovaries are markedly enlarged bilaterally, measuring 8.7 x 8.8 x 8.7 cm on the right and 8.0 x 8.0 x 10.0 cm on the left. These ovaries are predominantly centrally low-attenuation, but demonstrate thick peripheral enhancement and some thick internal septations and nodularity, the overall appearance is most suggestive of neoplasm. Other:  Moderate volume of ascites. Widespread nodularity throughout the peritoneal cavity, with peritoneal implants measuring up to 2.9 x 2.2 cm (image 51 of series 2) associated with the transverse colon. No pneumoperitoneum. Musculoskeletal: No aggressive appearing lytic or blastic lesions are noted in the visualized portions of the skeleton. IMPRESSION: 1. Although there is no clot associated with the patient's IVC filter, the long segment stents which extend from the external iliac veins into the IVC bilaterally are both thrombosed. Extensive venous collateralization bypasses this obstruction, as discussed above. 2. 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 In addition, there is some left pelvic sidewall and retroperitoneal lymphadenopathy, 2 suspicious splenic lesions, as well as a large hypovascular metastatic lesion in the right lobe of the liver, as discussed above. 3. Markedly enlarged ovaries bilaterally, the appearance of which is most typical for primary ovarian neoplasm. While this may relate seen the underlying metastatic disease to the ovaries, the development of a secondary neoplasm is not excluded, and clinical correlation is suggested. 4. Large amount of fluid in the endometrial canal, suggesting post treatment related cervical stenosis. 5. Large lesion in the posterior aspect of the uterine fundus favored to represent a large fibroid, as above. 6. Additional incidental findings, as above. These results were called by telephone at the time of interpretation on 10/02/2015 at 10:03 pm to Dr. Donnald Garre, who verbally acknowledged these results. Electronically Signed   By: Vinnie Langton M.D.   On: 10/02/2015 22:10     Medical Consultants:    None.  Anti-Infectives:  Anti-infectives    None      Subjective:    Erica Huffman she relates her pain is controlled with narcotics.  Objective:    Filed Vitals:   10/02/15 2330 10/03/15 0000 10/03/15 0038  10/03/15 0540  BP: 133/81 131/78 145/85 143/67  Pulse: 104 105 108 99  Temp:   98.3 F (36.8 C) 98 F (36.7 C)  TempSrc:   Oral Oral  Resp: 15 17 18 18   Height:   5\' 4"  (1.626 m)   Weight:   81.239 kg (179 lb 1.6 oz)   SpO2: 98% 92% 99% 99%    Intake/Output Summary (Last 24 hours) at 10/03/15 0950 Last data filed at 10/03/15 0700  Gross per 24 hour  Intake  804.8 ml  Output    200 ml  Net  604.8 ml   Filed Weights   10/03/15 0038  Weight: 81.239 kg (179 lb 1.6 oz)    Exam: Gen:  NAD Cardiovascular:  RRR. Chest and lungs:   CTAB Abdomen:  Abdomen soft, NT/ND, + BS Extremities:  No edema   Data Reviewed:    Labs: Basic Metabolic Panel:  Recent Labs Lab 10/02/15 1550 10/03/15 0158  NA 134* 131*  K 4.3 3.1*  CL 92* 92*  CO2 29 28  GLUCOSE 110* 119*  BUN 10 9  CREATININE 1.25* 1.27*  CALCIUM 9.1 8.9   GFR Estimated Creatinine Clearance: 52.8 mL/min (by C-G formula based on Cr of 1.27). Liver Function Tests:  Recent Labs Lab 10/03/15 0158  AST 15  ALT 43  ALKPHOS 88  BILITOT 0.5  PROT 7.5  ALBUMIN 2.9*   No results for input(s): LIPASE, AMYLASE in the last 168 hours. No results for input(s): AMMONIA in the last 168 hours. Coagulation profile  Recent Labs Lab 10/02/15 1550  INR 1.31    CBC:  Recent Labs Lab 10/02/15 1550 10/03/15 0158 10/03/15 0801  WBC 5.3 5.1 5.4  NEUTROABS 3.6  --   --   HGB 8.5* 8.6* 8.2*  HCT 28.8* 28.9* 27.3*  MCV 70.8* 70.5* 71.1*  PLT 518* 485* 443*   Cardiac Enzymes:  Recent Labs Lab 10/03/15 0158 10/03/15 0801  TROPONINI <0.03 <0.03   BNP (last 3 results) No results for input(s): PROBNP in the last 8760 hours. CBG:  Recent Labs Lab 10/03/15 0842  GLUCAP 112*   D-Dimer: No results for input(s): DDIMER in the last 72 hours. Hgb A1c: No results for input(s): HGBA1C in the last 72 hours. Lipid Profile: No results for input(s): CHOL, HDL, LDLCALC, TRIG, CHOLHDL, LDLDIRECT in the last 72  hours. Thyroid function studies: No results for input(s): TSH, T4TOTAL, T3FREE, THYROIDAB in the last 72 hours.  Invalid input(s): FREET3 Anemia work up: No results for input(s): VITAMINB12, FOLATE, FERRITIN, TIBC, IRON, RETICCTPCT in the last 72 hours. Sepsis Labs:  Recent Labs Lab 10/02/15 1550 10/03/15 0158 10/03/15 0801  WBC 5.3 5.1 5.4   Microbiology No results found for this or any previous visit (from the past 240 hour(s)).   Medications:   . cyclobenzaprine  10 mg Oral BID  . docusate sodium  100 mg Oral BID  . multivitamin with minerals  1 tablet Oral Daily  . sodium chloride flush  3 mL Intravenous Q12H   Continuous Infusions: . sodium chloride 125 mL/hr at 10/03/15 0200  . heparin 1,200 Units/hr (10/03/15 0201)    Time spent: 25 Min   LOS: 0 days   FELIZ Marguarite Arbour  [facility]  Pager [phone]  *Please refer to [email or web address], password [email or web address] to get updated schedule on who will round on this patient, as hospitalists switch teams weekly. If 7PM-7AM, please contact night-coverage at [email or web address], password [email or web address] for any overnight needs.  10/03/2015, 9:50 AM

## 2015-10-03 NOTE — Progress Notes (Signed)
ANTICOAGULATION CONSULT NOTE - Initial Consult  Pharmacy Consult for enoxaparin Indication: PE and DVT  Allergies  Allergen Reactions  . Shellfish Allergy Anaphylaxis and Swelling    Patient Measurements: Height: 5\' 4"  (162.6 cm) Weight: 179 lb 1.6 oz (81.239 kg) IBW/kg (Calculated) : 54.7 Heparin Dosing Weight:   Vital Signs: Temp: 98 F (36.7 C) (03/29 0540) Temp Source: Oral (03/29 0540) BP: 143/67 mmHg (03/29 0540) Pulse Rate: 99 (03/29 0540)  Labs:  Recent Labs  10/02/15 1550 10/03/15 0158 10/03/15 0801  HGB 8.5* 8.6* 8.2*  HCT 28.8* 28.9* 27.3*  PLT 518* 485* 443*  APTT  --  51*  --   LABPROT 16.4*  --   --   INR 1.31  --   --   CREATININE 1.25* 1.27*  --   TROPONINI  --  <0.03 <0.03    Estimated Creatinine Clearance: 52.8 mL/min (by C-G formula based on Cr of 1.27).   Medical History: Past Medical History  Diagnosis Date  . Cancer ([place])   . Cervical cancer ([place])   . Asthma   . Hypertension   . Fibroid     Medications:  Scheduled:  . cyclobenzaprine  10 mg Oral BID  . docusate sodium  100 mg Oral BID  . enoxaparin (LOVENOX) injection  80 mg Subcutaneous Q12H  . multivitamin with minerals  1 tablet Oral Daily  . sodium chloride flush  3 mL Intravenous Q12H    Assessment: Pharmacy is consulted to dose enoxaparin in 55 yo female with history of bilateral lower extremity DVTs. Pt has failed both Xarelto and Coumadin as outpt. Pt was started on heparin drip in case of need for direct  thrombolytic therapy. However, there is no need for surgical intervention at this point,so pt is being transitioned back to enoxaparin. Baseline labs: Hgb low but stable. Platelets WNL.    Goal of Therapy:  Anti-Xa level 0.6-1 units/ml 4hrs after LMWH dose given Monitor platelets by anticoagulation protocol: Yes   Plan:   Lovenox 80 mg [place] q12h.  Prior to admission, pt was on enoxaparin 100 mg [place] q12h. When medication on 04/18/15, pt listed weight was 98 kg, and  on 07/13/15 weight was 93 kg. Did not note any anti-Xa studies that indicate that pt was on 100 mg dose due to levels. Therefore, will dose at with current patient weight.   Monitor for signs and symptoms of bleeding     Royetta Asal, PharmD, BCPS Pager [phone] 10/03/2015 11:59 AM

## 2015-10-03 NOTE — Progress Notes (Signed)
Patient alert and oriented, denies any distress, no changes from prior nurse's assessment.

## 2015-10-03 NOTE — Progress Notes (Signed)
Occupational Therapy Evaluation Patient Details Name: Erica Huffman MRN: [id number] DOB: 1960-09-03 Today's Date: 10/03/2015    History of Present Illness Erica Huffman is a 55 y.o. 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   Clinical Impression   Patient presents to OT with decreased ADL independence due to the deficits listed below. OT will follow to maximize function and to facilitate a safe discharge plan.    Follow Up Recommendations  Supervision/Assistance - 24 hour;Home health OT;SNF  -- depends on progress.   Equipment Recommendations  Tub/shower bench;3 in 1 bedside comode    Recommendations for Other Services PT consult     Precautions / Restrictions Precautions Precautions: Fall Restrictions Weight Bearing Restrictions: No      Mobility Bed Mobility Overal bed mobility: Independent             General bed mobility comments: Patient frequently changing positions from lying down, rolling, long sitting, etc during evaluation.  Transfers                      Balance                                            ADL Overall ADL's : Needs assistance/impaired Eating/Feeding: Independent;Bed level   Grooming: Set up;Bed level   Upper Body Bathing: Set up;Bed level   Lower Body Bathing: Minimal assistance;Bed level       Lower Body Dressing: Minimal assistance;Bed level                 General ADL Comments: Patient with significant L groin pain limiting eval to bed per patient request. She reports she got up to [facility] with nursing earlier and did well with the transfer. Able to reach most body parts -- difficulty getting to L lower leg/foot due to groin pain. Will benefit from AE -- will bring next session. Patient also reports  difficulty at home with tub transfer and would benefit from tub bench.     Vision     Perception     Praxis      Pertinent Vitals/Pain Pain Assessment: 0-10 Pain Score: 8  Pain Location: L groin Pain Descriptors / Indicators: Grimacing;Aching;Radiating;Sore Pain Intervention(s): Premedicated before session;Limited activity within patient's tolerance;Monitored during session     Hand Dominance Right   Extremity/Trunk Assessment Upper Extremity Assessment Upper Extremity Assessment: Overall WFL for tasks assessed   Lower Extremity Assessment Lower Extremity Assessment: Defer to PT evaluation       Communication Communication Communication: No difficulties   Cognition Arousal/Alertness: Awake/alert Behavior During Therapy: WFL for tasks assessed/performed Overall Cognitive Status: Within Functional Limits for tasks assessed                     General Comments       Exercises       Shoulder Instructions      Home Living Family/patient expects to be discharged to:: Private residence Living Arrangements: Children;Parent Available Help at Discharge: Family;Available PRN/intermittently (family members work) Type of Home: House Home Access: Stairs to enter Technical brewer of Steps: 1 Entrance Stairs-Rails: None Home Layout: One level     Bathroom Shower/Tub: Tub/shower  unit   Bathroom Toilet: Standard Bathroom Accessibility: Yes How Accessible: Accessible via walker Home Equipment: [place] - single point;Wheelchair - manual          Prior Functioning/Environment Level of Independence: Needs assistance  Gait / Transfers Assistance Needed: provides steady assist during transfers, ambulation short distances with cane. Also uses w/c ADL's / Homemaking Assistance Needed: needed assistance to get in shower, needs assistance with housework/laundry/meals        OT Diagnosis: Acute pain;Generalized weakness   OT Problem List: Decreased  strength;Decreased activity tolerance;Decreased knowledge of use of DME or AE;Pain   OT Treatment/Interventions: Self-care/ADL training;DME and/or AE instruction;Therapeutic activities;Patient/family education    OT Goals(Current goals can be found in the care plan section) Acute Rehab OT Goals Patient Stated Goal: decrease pain OT Goal Formulation: With patient Time For Goal Achievement: 10/17/15 Potential to Achieve Goals: Good ADL Goals Pt Will Perform Lower Body Bathing: with modified independence;with adaptive equipment;sit to/from stand Pt Will Perform Lower Body Dressing: with modified independence;with adaptive equipment;sit to/from stand Pt Will Transfer to Toilet: with modified independence;stand pivot transfer;ambulating;bedside commode Pt Will Perform Toileting - Clothing Manipulation and hygiene: with modified independence;sit to/from stand Pt Will Perform Tub/Shower Transfer: with supervision;tub bench;Tub transfer  OT Frequency: Min 2X/week   Barriers to D/C: Decreased caregiver support  family all works; no 24/7 available       Co-evaluation              End of Session    Activity Tolerance: Patient limited by pain Patient left: in bed;with call bell/phone within reach   Time: 1047-1102 OT Time Calculation (min): 15 min Charges:  OT General Charges $OT Visit: 1 Procedure OT Evaluation $OT Eval Moderate Complexity: 1 Procedure G-Codes:    Nariya Neumeyer A 2015-11-01, 12:04 PM

## 2015-10-03 NOTE — ED Notes (Signed)
Hospitalist at bedside 

## 2015-10-03 NOTE — Progress Notes (Signed)
PT Cancellation Note  Patient Details Name: Erica Huffman MRN: [id number] DOB: March 27, 1961   Cancelled Treatment:    Reason Eval/Treat Not Completed: Fatigue/lethargy limiting ability to participate. Pt requested PT check back on tomorrow.    Weston Anna, MPT Pager: [phone]

## 2015-10-03 NOTE — Progress Notes (Signed)
ANTICOAGULATION CONSULT NOTE - Initial Consult  Pharmacy Consult for heparin Indication: pulmonary embolus and DVT, enoxaparin bridging  Allergies  Allergen Reactions  . Shellfish Allergy Anaphylaxis and Swelling    Patient Measurements: Height: 5\' 4"  (162.6 cm) Weight: 179 lb 1.6 oz (81.239 kg) IBW/kg (Calculated) : 54.7 Heparin Dosing Weight:   Vital Signs: Temp: 98.3 F (36.8 C) (03/29 0038) Temp Source: Oral (03/29 0038) BP: 145/85 mmHg (03/29 0038) Pulse Rate: 108 (03/29 0038)  Labs:  Recent Labs  10/02/15 1550  HGB 8.5*  HCT 28.8*  PLT 518*  LABPROT 16.4*  INR 1.31  CREATININE 1.25*    Estimated Creatinine Clearance: 53.7 mL/min (by C-G formula based on Cr of 1.25).   Medical History: Past Medical History  Diagnosis Date  . Cancer ([place])   . Cervical cancer ([place])   . Asthma   . Hypertension   . Fibroid     Medications:  Prescriptions prior to admission  Medication Sig Dispense Refill Last Dose  . acetaminophen (TYLENOL) 325 MG tablet Take 325 mg by mouth every 4 (four) hours as needed for moderate pain.    10/02/2015 at Unknown time  . albuterol (PROVENTIL HFA;VENTOLIN HFA) 108 (90 BASE) MCG/ACT inhaler Inhale 2 puffs into the lungs every 6 (six) hours as needed for wheezing or shortness of breath.   Past Week at Unknown time  . cyclobenzaprine (FLEXERIL) 10 MG tablet Take 10 mg by mouth 2 (two) times daily.   10/02/2015 at Unknown time  . docusate sodium (COLACE) 100 MG capsule Take 100 mg by mouth 2 (two) times daily.   10/02/2015 at Unknown time  . enoxaparin (LOVENOX) 100 MG/ML injection Inject 100 mg into the skin every 12 (twelve) hours.   10/02/2015 at 0900  . guaiFENesin (ROBITUSSIN) 100 MG/5ML liquid Take 200 mg by mouth 2 (two) times daily as needed for cough.   2 weeks  . hydrochlorothiazide (HYDRODIURIL) 25 MG tablet Take 1 tablet (25 mg total) by mouth daily. 30 tablet 1 10/02/2015 at Unknown time  . Multiple Vitamin (MULTIVITAMIN WITH MINERALS)  TABS tablet Take 1 tablet by mouth daily.   Past Week at Unknown time  . oxyCODONE-acetaminophen (PERCOCET/ROXICET) 5-325 MG tablet Take 1 tablet by mouth every 4 (four) hours as needed for severe pain. 30 tablet 0 10/02/2015 at Unknown time  . polyethylene glycol (MIRALAX / GLYCOLAX) packet Take 17 g by mouth daily as needed for mild constipation or moderate constipation.   Past Week at Unknown time  . rivaroxaban (XARELTO) 20 MG TABS tablet Take 1 tablet (20 mg total) by mouth daily with supper. 30 tablet 5 Taking  . XARELTO STARTER PACK 15 & 20 MG TBPK Take 15-20 mg by mouth as directed. Take as directed on package: Start with one 15mg  tablet by mouth twice a day with food. On Day 22, switch to one 20mg  tablet once a day with food. 51 each 0 Taking   Infusions:  . sodium chloride    . heparin      Assessment: Patient taking enoxaparin 100mg  sq q12hr (last dose noted 3/28 at 0900), while patients weight currently 81kg.  Will not bolus heparin drip, in case enoxaparin accumulated.   Goal of Therapy:  Heparin level 0.3-0.7 units/ml Monitor platelets by anticoagulation protocol: Yes   Plan:  Heparin drip at 1200 units/hr Daily CBC Heparin level at [place], Shea Stakes [place] 10/03/2015,1:36 AM

## 2015-10-03 NOTE — Care Management Note (Signed)
Case Management Note  Patient Details  Name: JC YARWOOD MRN: [id number] Date of Birth: 1961/06/18  Subjective/Objective:  55 y/o f admitted w/l leg DVT. [place] move [place] state to [place]. No insurance-she agrees to [facility] rep following. Patient will go to DSS to apply for medicaid.Has family here in [place], has enough lovenox refills, has own transp home. Informed her of DME recc-she will get dme @ [facility] since they are out of pocket expense items(tub bench/shower chair, 3n1).                 Action/Plan:d/c home.   Expected Discharge Date:                  Expected Discharge Plan:  Home/Self Care  In-House Referral:     Discharge planning Services  CM Consult, [facility]  Post Acute Care Choice:    Choice offered to:     DME Arranged:    DME Agency:     HH Arranged:    HH Agency:     Status of Service:  In process, will continue to follow  Medicare Important Message Given:    Date Medicare IM Given:    Medicare IM give by:    Date Additional Medicare IM Given:    Additional Medicare Important Message give by:     If discussed at [place] of Stay Meetings, dates discussed:    Additional Comments:  Dessa Phi, RN 10/03/2015, 3:28 PM

## 2015-10-04 DIAGNOSIS — R1030 Lower abdominal pain, unspecified: Secondary | ICD-10-CM

## 2015-10-04 LAB — CBC
HCT: 27.1 % — ABNORMAL LOW (ref 36.0–46.0)
HCT: 26.3 % — ABNORMAL LOW (ref 36.0–46.0)
HEMOGLOBIN: 7.7 g/dL — AB (ref 12.0–15.0)
Hemoglobin: 8 g/dL — ABNORMAL LOW (ref 12.0–15.0)
MCH: 21.2 pg — ABNORMAL LOW (ref 26.0–34.0)
MCH: 21 pg — ABNORMAL LOW (ref 26.0–34.0)
MCHC: 29.5 g/dL — AB (ref 30.0–36.0)
MCHC: 29.3 g/dL — ABNORMAL LOW (ref 30.0–36.0)
MCV: 71.7 fL — ABNORMAL LOW (ref 78.0–100.0)
MCV: 71.9 fL — ABNORMAL LOW (ref 78.0–100.0)
PLATELETS: 448 10*3/uL — AB (ref 150–400)
PLATELETS: 422 10*3/uL — AB (ref 150–400)
RBC: 3.78 MIL/uL — ABNORMAL LOW (ref 3.87–5.11)
RBC: 3.66 MIL/uL — AB (ref 3.87–5.11)
RDW: 20.4 % — AB (ref 11.5–15.5)
RDW: 20.3 % — ABNORMAL HIGH (ref 11.5–15.5)
WBC: 6 10*3/uL (ref 4.0–10.5)
WBC: 6.2 10*3/uL (ref 4.0–10.5)

## 2015-10-04 LAB — RETICULOCYTES
RBC.: 3.83 MIL/uL — AB (ref 3.87–5.11)
RETIC COUNT ABSOLUTE: 61.3 10*3/uL (ref 19.0–186.0)
RETIC CT PCT: 1.6 % (ref 0.4–3.1)

## 2015-10-04 LAB — GLUCOSE, CAPILLARY: GLUCOSE-CAPILLARY: 108 mg/dL — AB (ref 65–99)

## 2015-10-04 LAB — VITAMIN B12: Vitamin B-12: 419 pg/mL (ref 180–914)

## 2015-10-04 LAB — IRON AND TIBC
Iron: 18 ug/dL — ABNORMAL LOW (ref 28–170)
Saturation Ratios: 8 % — ABNORMAL LOW (ref 10.4–31.8)
TIBC: 234 ug/dL — ABNORMAL LOW (ref 250–450)
UIBC: 216 ug/dL

## 2015-10-04 LAB — UREA NITROGEN, URINE: Urea Nitrogen, Ur: 273 mg/dL

## 2015-10-04 LAB — FOLATE: Folate: 6.2 ng/mL (ref >5.9)

## 2015-10-04 LAB — FERRITIN: FERRITIN: 615 ng/mL — AB (ref 11–307)

## 2015-10-04 LAB — HAPTOGLOBIN: HAPTOGLOBIN: 336 mg/dL — AB (ref 34–200)

## 2015-10-04 MED ORDER — ENOXAPARIN SODIUM 80 MG/0.8ML ~~LOC~~ SOLN: 80.0000 mg | Freq: Two times a day (BID) | SUBCUTANEOUS | Status: AC

## 2015-10-04 MED ORDER — OXYCODONE-ACETAMINOPHEN 7.5-325 MG PO TABS: 2.0000 | ORAL_TABLET | ORAL | Status: DC | PRN

## 2015-10-04 MED ORDER — OXYCODONE HCL 5 MG PO TABS: 10.0000 mg | ORAL_TABLET | ORAL | Status: DC | PRN

## 2015-10-04 MED ORDER — OXYCODONE-ACETAMINOPHEN 5-325 MG PO TABS: 1.0000 | ORAL_TABLET | ORAL | Status: DC | PRN

## 2015-10-04 NOTE — Discharge Summary (Addendum)
Physician Discharge Summary  Erica Huffman [id number] DOB: 07/28/1960 DOA: 10/02/2015  PCP: Pcp Not In System  Admit date: 10/02/2015 Discharge date: 10/04/2015  Time spent: 41minutes  Recommendations for Outpatient Follow-up:  1. Follow-up with oncology as an outpatient.   Discharge Diagnoses:  Principal Problem:   Groin pain Active Problems:   DVT (deep venous thrombosis) (HCC)   Cervical cancer (HCC)   Asthma   Hypertension   Microcytic anemia   LBBB (left bundle branch block)   AKI (acute kidney injury) ([place])   DVT (deep vein thrombosis) in pregnancy   Deep vein thrombosis (DVT) of iliac vein of both lower extremities ([place])   Discharge Condition: stable  Diet recommendation: regular  Filed Weights   10/03/15 0038  Weight: 81.239 kg (179 lb 1.6 oz)    History of present illness:  55 year old with past medical history of cervical cancer status post chemotherapy and radiation 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  Hospital Course:  Groin pain/History ofBilateral DVT with IVC filter: CT venogram was done that showed extensive venous collateralization which bypassing this obstruction. Patient IVC filter. IR was consulted and recommended no further intervention at this point. Her Lovenox dose was adjusted. She will continue this in outpatient follow-up with oncology. Her groin Pain is likely due to edema, which is due to passive congestion caused by her IVC filter, which is now improved. Continue narcotics as an outpatient.  History of cervical cancer metastatic: Status post chemotherapy and radiation therapy. She will follow-up with oncology as an outpatient.  Asthma: Remained stable no changes were made.  Essential hypertension: No changes were made to her medication.  Microcytic anemia: Her ferritin was significantly elevated when her anemia panel was checked.  This will have to be repeated as an outpatient. Mild drop in her hemoglobin from a year ago she's had no signs of overt bleeding she denies any melena or bleeding. She'll follow up with oncology as an outpatient.  Procedures:  CT venogram  Consultations:  none  Discharge Exam: Filed Vitals:   10/03/15 2048 10/04/15 0543  BP: 129/77 127/78  Pulse: 103 105  Temp: 98.4 F (36.9 C) 98.3 F (36.8 C)  Resp: 21 20    General: A&O x3 Cardiovascular: RRR Respiratory: good air movement CTA B/L  Discharge Instructions   Discharge Instructions    Diet - low sodium heart healthy    Complete by:  As directed      Increase activity slowly    Complete by:  As directed           Current Discharge Medication List    CONTINUE these medications which have CHANGED   Details  enoxaparin (LOVENOX) 80 MG/0.8ML injection Inject 0.8 mLs (80 mg total) into the skin every 12 (twelve) hours. Qty: 30 Syringe, Refills: 2    oxyCODONE-acetaminophen (PERCOCET/ROXICET) 5-325 MG tablet Take 1-2 tablets by mouth every 4 (four) hours as needed for severe pain. Qty: 30 tablet, Refills: 0      CONTINUE these medications which have NOT CHANGED   Details  acetaminophen (TYLENOL) 325 MG tablet Take 325 mg by mouth every 4 (four) hours as needed for moderate pain.     albuterol (PROVENTIL HFA;VENTOLIN HFA) 108 (90 BASE) MCG/ACT inhaler Inhale 2 puffs into the lungs every 6 (six) hours as needed for wheezing or shortness of breath.    cyclobenzaprine (FLEXERIL) 10 MG tablet Take  10 mg by mouth 2 (two) times daily.    docusate sodium (COLACE) 100 MG capsule Take 100 mg by mouth 2 (two) times daily.    guaiFENesin (ROBITUSSIN) 100 MG/5ML liquid Take 200 mg by mouth 2 (two) times daily as needed for cough.    hydrochlorothiazide (HYDRODIURIL) 25 MG tablet Take 1 tablet (25 mg total) by mouth daily. Qty: 30 tablet, Refills: 1    Multiple Vitamin (MULTIVITAMIN WITH MINERALS) TABS tablet Take 1 tablet  by mouth daily.    polyethylene glycol (MIRALAX / GLYCOLAX) packet Take 17 g by mouth daily as needed for mild constipation or moderate constipation.      STOP taking these medications     rivaroxaban (XARELTO) 20 MG TABS tablet      XARELTO STARTER PACK 15 & 20 MG TBPK        Allergies  Allergen Reactions  . Shellfish Allergy Anaphylaxis and Swelling   Follow-up Information    Follow up with [facility] On 10/12/2015.   Why:  Hospital follow-up appointment on 10/12/15 at 2:30 pm with Dr. Jarold Song.   Contact information:   [address] [place] [phone]       The results of significant diagnostics from this hospitalization (including imaging, microbiology, ancillary and laboratory) are listed below for reference.    Significant Diagnostic Studies: Ct Venogram Abd/pel  10/02/2015  CLINICAL DATA:  55 year old female with history of cervical cancer and peritoneal carcinomatosis. History of IVC filter. Increasing lower leg pain. Evaluate for thrombosis. EXAM: CT ABDOMEN AND PELVIS WITH CONTRAST TECHNIQUE: Multidetector CT imaging of the abdomen and pelvis was performed using the standard protocol following bolus administration of intravenous contrast. CONTRAST:  168mL ISOVUE-300 IOPAMIDOL (ISOVUE-300) INJECTION 61% COMPARISON:  CT the abdomen and pelvis 08/15/2010. FINDINGS: Lower chest:  Unremarkable. Hepatobiliary: 5.2 x 4.5 cm hypovascular mass in the posterior aspect of the right lobe of the liver involving both segments 6 in segments 7, presumably a metastatic lesion. No intra or extrahepatic biliary ductal dilatation. Status post cholecystectomy. Pancreas: No pancreatic mass. No pancreatic ductal dilatation. Extensive peripancreatic fluid. Spleen: There are 2 hypovascular areas near the hilum of the spleen which persist on delayed phase imaging, suspicious for metastatic lesions, the largest of which measures up to 1.6 cm  (image 24 of series 2). Adrenals/Urinary Tract: 11 mm simple cyst in the upper pole of the right kidney. Left kidney is normal in appearance. Mild left-sided hydroureteronephrosis. No right-sided hydroureteronephrosis. Urinary bladder appears mildly thickened cyst, but is partially decompressed. Bilateral adrenal glands are normal in appearance. Stomach/Bowel: The appearance of the stomach is normal. There is no pathologic dilatation of small bowel or colon. Vascular/Lymphatic: IVC filter in position with tip terminating at the level of the renal veins. There does not appear to be thrombus within the IVC filter itself. However, there are long stents beneath the filter which extend from the external iliac veins bilaterally into the inferior vena cava, and both of these stents appear to be nearly (minimal flow in a short portion of the right stent) completely occluded. There is extensive collateralization around the stents communicating with the inferior vena cava at the level of the IVC filter, much of which appears to be coming from dilated right gonadal veins (multiple varices are noted). Additionally, there are numerous collaterals throughout the anterior abdominal wall. Mildly enlarged retroperitoneal lymph node in the left para-aortic nodal station measuring 1.2 cm in short axis. Soft tissue along  the left pelvic side wall suspicious for left external iliac lymphadenopathy measuring up to 1.5 cm in diameter (image 69 of series 2), partially inseparable from the process in the left adnexal region. Reproductive: Uterus is again markedly enlarged and heterogeneous in appearance, including what appears to be a large fibroid in the posterior aspect of the uterus measuring 6.0 x 9.2 x 9.2 cm. Large amount of fluid in the endometrial canal, measuring up to 3.2 cm in thickness in the fundus. Lower uterine segment and cervical region is grossly unremarkable in appearance. The ovaries are markedly enlarged bilaterally,  measuring 8.7 x 8.8 x 8.7 cm on the right and 8.0 x 8.0 x 10.0 cm on the left. These ovaries are predominantly centrally low-attenuation, but demonstrate thick peripheral enhancement and some thick internal septations and nodularity, the overall appearance is most suggestive of neoplasm. Other: Moderate volume of ascites. Widespread nodularity throughout the peritoneal cavity, with peritoneal implants measuring up to 2.9 x 2.2 cm (image 51 of series 2) associated with the transverse colon. No pneumoperitoneum. Musculoskeletal: No aggressive appearing lytic or blastic lesions are noted in the visualized portions of the skeleton. IMPRESSION: 1. Although there is no clot associated with the patient's IVC filter, the long segment stents which extend from the external iliac veins into the IVC bilaterally are both thrombosed. Extensive venous collateralization bypasses this obstruction, as discussed above. 2. 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 In addition, there is some left pelvic sidewall and retroperitoneal lymphadenopathy, 2 suspicious splenic lesions, as well as a large hypovascular metastatic lesion in the right lobe of the liver, as discussed above. 3. Markedly enlarged ovaries bilaterally, the appearance of which is most typical for primary ovarian neoplasm. While this may relate seen the underlying metastatic disease to the ovaries, the development of a secondary neoplasm is not excluded, and clinical correlation is suggested. 4. Large amount of fluid in the endometrial canal, suggesting post treatment related cervical stenosis. 5. Large lesion in the posterior aspect of the uterine fundus favored to represent a large fibroid, as above. 6. Additional incidental findings, as above. These results were called by telephone at the time of interpretation on 10/02/2015 at 10:03 pm to Dr. Donnald Garre, who verbally acknowledged these results. Electronically Signed   By: Vinnie Langton M.D.   On: 10/02/2015 22:10    Microbiology: No results found for this or any previous visit (from the past 240 hour(s)).   Labs: Basic Metabolic Panel:  Recent Labs Lab 10/02/15 1550 10/03/15 0158  NA 134* 131*  K 4.3 3.1*  CL 92* 92*  CO2 29 28  GLUCOSE 110* 119*  BUN 10 9  CREATININE 1.25* 1.27*  CALCIUM 9.1 8.9   Liver Function Tests:  Recent Labs Lab 10/03/15 0158  AST 15  ALT 43  ALKPHOS 88  BILITOT 0.5  PROT 7.5  ALBUMIN 2.9*   No results for input(s): LIPASE, AMYLASE in the last 168 hours. No results for input(s): AMMONIA in the last 168 hours. CBC:  Recent Labs Lab 10/02/15 1550  10/03/15 0801 10/03/15 1433 10/03/15 1954 10/04/15 0104 10/04/15 0641  WBC 5.3  < > 5.4 5.2 6.0 6.0 6.2  NEUTROABS 3.6  --   --   --   --   --   --   HGB 8.5*  < > 8.2* 8.1* 8.3* 8.0* 7.7*  HCT 28.8*  < > 27.3* 26.8* 27.9* 27.1* 26.3*  MCV 70.8*  < > 71.1* 71.5* 70.8* 71.7* 71.9*  PLT 518*  < > 443* 418* 429* 448* 422*  < > = values in this interval not displayed. Cardiac Enzymes:  Recent Labs Lab 10/03/15 0158 10/03/15 0801 10/03/15 1433  TROPONINI <0.03 <0.03 <0.03   BNP: BNP (last 3 results) No results for input(s): BNP in the last 8760 hours.  ProBNP (last 3 results) No results for input(s): PROBNP in the last 8760 hours.  CBG:  Recent Labs Lab 10/03/15 0842 10/04/15 0810  GLUCAP 112* 108*       Signed:  Charlynne Cousins MD.  [facility] 10/04/2015, 12:31 PM

## 2015-10-04 NOTE — Progress Notes (Signed)
Occupational Therapy Treatment Patient Details Name: Erica Huffman MRN: [id number] DOB: [birth year] Today's Date: 10/04/2015    History of present illness 55 y.o. 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   OT comments  Issued AE  Follow Up Recommendations  Supervision/Assistance - 24 hour;Home health OT;SNF    Equipment Recommendations  Tub/shower bench;3 in 1 bedside comode    Recommendations for Other Services PT consult    Precautions / Restrictions Precautions Precautions: Fall Restrictions Weight Bearing Restrictions: No       Mobility Bed Mobility Overal bed mobility: Needs Assistance Bed Mobility: Sit to Supine       Sit to supine: Mod assist   General bed mobility comments: Assist for bil LEs. Increased time.   Transfers Overall transfer level: Needs assistance Equipment used: 1 person hand held assist Transfers: Sit to/from Stand           General transfer comment: close guard for safety. Increased time.     Balance Overall balance assessment: Needs assistance         Standing balance support: During functional activity Standing balance-Leahy Scale: Fair                     ADL Overall ADL's : Needs assistance/impaired                                       General ADL Comments: Pt fatigued s/p PT.  OT issued and educated pt on AE and how to use. Pt thankful and felt she would benefit from this as bending is very hard.  Ptis also in pain in her abdomen- but she has had her pain meds      Vision                            Cognition   Behavior During Therapy: WFL for tasks assessed/performed Overall Cognitive Status: Within Functional Limits for tasks assessed                       Extremity/Trunk  Assessment  Upper Extremity Assessment Upper Extremity Assessment: Overall WFL for tasks assessed   Lower Extremity Assessment Lower Extremity Assessment: Generalized weakness;LLE deficits/detail LLE Deficits / Details: pt needed assist to get on /off bed LLE: Unable to fully assess due to pain   Cervical / Trunk Assessment Cervical / Trunk Assessment: Normal    Exercises     Shoulder Instructions       General Comments      Pertinent Vitals/ Pain       Pain Assessment: 0-10 Pain Score: 8  Pain Location: abdomen/ Groin Pain Descriptors / Indicators: Grimacing;Aching;Sore Pain Intervention(s): Monitored during session;Limited activity within patient's tolerance;Repositioned  Home Living Family/patient expects to be discharged to:: Private residence Living Arrangements: Children;Parent Available Help at Discharge: Family;Available PRN/intermittently Type of Home: House Home Access: Stairs to enter [employer] of Steps: 1 Entrance Stairs-Rails: None Home Layout: One level               Home Equipment: Cane - single point;Wheelchair - [employer] - 2 wheels          Prior Functioning/Environment Level  of Independence: Needs assistance  Gait / Transfers Assistance Needed: ambulation short distances with cane. Also uses w/c ADL's / Homemaking Assistance Needed: needed assistance to get in shower, needs assistance with housework/laundry/meals       Frequency Min 2X/week     Progress Toward Goals  OT Goals(current goals can now be found in the care plan section)  Progress towards OT goals: Progressing toward goals  Acute Rehab OT Goals Patient Stated Goal: decrease pain  Plan Discharge plan remains appropriate    Co-evaluation                 End of Session     Activity Tolerance Patient limited by pain   Patient Left in bed;with call bell/phone within reach   Nurse Communication          Time: 1040-1055 OT Time Calculation  (min): 15 min  Charges: OT Treatments $Self Care/Home Management : 8-22 mins  Taryn Nave, Edwena Felty D 10/04/2015, 12:17 PM

## 2015-10-04 NOTE — Evaluation (Signed)
Physical Therapy Evaluation Patient Details Name: Erica Huffman MRN: [id number] DOB: Oct 16, 1960 Today's Date: 10/04/2015   History of Present Illness  55 y.o. 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  Clinical Impression  On eval, pt required Mod assist for bed mobility, Min guard to ambulate ~140 feet with RW. Pain rated 7/10 back, L groin. Will follow and progress activity as tolerated.     Follow Up Recommendations  home safety evaluation. ( HHPT if possible)    Equipment Recommendations  None recommended by PT    Recommendations for Other Services OT consult     Precautions / Restrictions Precautions Precautions: Fall Restrictions Weight Bearing Restrictions: No      Mobility  Bed Mobility Overal bed mobility: Needs Assistance Bed Mobility: Sit to Supine       Sit to supine: Mod assist   General bed mobility comments: Assist for bil LEs. Increased time.   Transfers Overall transfer level: Needs assistance Equipment used: Straight cane             General transfer comment: close guard for safety. Increased time.   Ambulation/Gait Ambulation/Gait assistance: Min guard Ambulation Distance (Feet): 140 Feet Assistive device: Rolling walker (2 wheeled) Gait Pattern/deviations: Step-through pattern;Decreased stride length;Trunk flexed     General Gait Details: slow gait speed. 2 brief standing rest breaks needed due to fatigue.   Stairs            Wheelchair Mobility    Modified Rankin (Stroke Patients Only)       Balance Overall balance assessment: Needs assistance         Standing balance support: During functional activity Standing balance-Leahy Scale: Fair                               Pertinent Vitals/Pain Pain Assessment:  0-10 Pain Score: 7  Pain Location: L groin, low back Pain Descriptors / Indicators: Grimacing;Aching;Sore Pain Intervention(s): Premedicated before session;Repositioned    Home Living Family/patient expects to be discharged to:: Private residence Living Arrangements: Children;Parent Available Help at Discharge: Family;Available PRN/intermittently Type of Home: House Home Access: Stairs to enter Entrance Stairs-Rails: None Entrance Stairs-Number of Steps: 1 Home Layout: One level Home Equipment: Cane - single point;Wheelchair - [employer] - 2 wheels      Prior Function Level of Independence: Needs assistance   Gait / Transfers Assistance Needed: ambulation short distances with cane. Also uses w/c  ADL's / Homemaking Assistance Needed: needed assistance to get in shower, needs assistance with housework/laundry/meals        Hand Dominance        Extremity/Trunk Assessment   Upper Extremity Assessment: Overall WFL for tasks assessed           Lower Extremity Assessment: Generalized weakness;LLE deficits/detail   LLE Deficits / Details: pt needed assist to get on /off bed  Cervical / Trunk Assessment: Normal  Communication   Communication: No difficulties  Cognition Arousal/Alertness: Awake/alert Behavior During Therapy: WFL for tasks assessed/performed Overall Cognitive Status: Within Functional Limits for tasks assessed                      General Comments      Exercises        Assessment/Plan  PT Assessment Patient needs continued PT services  PT Diagnosis Difficulty walking;Generalized weakness;Acute pain   PT Problem List Decreased strength;Decreased range of motion;Decreased activity tolerance;Decreased balance;Decreased mobility;Decreased knowledge of use of DME;Pain  PT Treatment Interventions Gait training;Functional mobility training;Therapeutic activities;Patient/family education;DME instruction;Therapeutic exercise   PT Goals  (Current goals can be found in the Care Plan section) Acute Rehab PT Goals Patient Stated Goal: decrease pain PT Goal Formulation: With patient Time For Goal Achievement: 10/18/15 Potential to Achieve Goals: Good    Frequency Min 3X/week   Barriers to discharge        Co-evaluation               End of Session   Activity Tolerance: Patient limited by fatigue;Patient limited by pain Patient left: in bed;with call bell/phone within reach;with bed alarm set           Time: 1022-1040 PT Time Calculation (min) (ACUTE ONLY): 18 min   Charges:   PT Evaluation $PT Eval Low Complexity: 1 Procedure     PT G Codes:        Weston Anna, MPT Pager: [phone]

## 2015-10-04 NOTE — Care Management Note (Signed)
Case Management Note  Patient Details  Name: Erica Huffman MRN: [id number] Date of Birth: 27-Sep-1960  Subjective/Objective: PT recc HHRN safety eval. Spoke to patient about HHC-will have [facility] rep Santiago Glad to talk to patient prior ordering since there is a cost, & patient concerned about cost.Will await outcome of [facility] rep Karen's conversation w/patient.                   Action/Plan:d/c plan home.   Expected Discharge Date:                  Expected Discharge Plan:  Home/Self Care  In-House Referral:     Discharge planning Services  CM Consult, [facility]  Post Acute Care Choice:    Choice offered to:     DME Arranged:    DME Agency:     HH Arranged:    HH Agency:     Status of Service:  In process, will continue to follow  Medicare Important Message Given:    Date Medicare IM Given:    Medicare IM give by:    Date Additional Medicare IM Given:    Additional Medicare Important Message give by:     If discussed at [place] of Stay Meetings, dates discussed:    Additional Comments:  Dessa Phi, RN 10/04/2015, 12:03 PM

## 2015-10-04 NOTE — Care Management Note (Signed)
Case Management Note  Patient Details  Name: Erica Huffman MRN: [id number] Date of Birth: 1961/04/27  Subjective/Objective: Patient does not qualify for [place] assistance per [facility] rep Karen-patient informed-patient declines to pay $150.00 out of pocket for HHRN-safety eval. MD notified.                    Action/Plan:d/c plan home.   Expected Discharge Date:                  Expected Discharge Plan:  Home/Self Care  In-House Referral:     Discharge planning Services  CM Consult, [facility]  Post Acute Care Choice:    Choice offered to:     DME Arranged:    DME Agency:     HH Arranged:    [place] Agency:     Status of Service:  Completed, signed off  Medicare Important Message Given:    Date Medicare IM Given:    Medicare IM give by:    Date Additional Medicare IM Given:    Additional Medicare Important Message give by:     If discussed at [place] of Stay Meetings, dates discussed:    Additional Comments:  Dessa Phi, RN 10/04/2015, 1:30 PM

## 2015-10-09 ENCOUNTER — Encounter: Payer: Self-pay | Admitting: Family Medicine

## 2015-10-09 ENCOUNTER — Ambulatory Visit: Payer: Medicaid - Out of State | Attending: Family Medicine | Admitting: Family Medicine

## 2015-10-09 VITALS — BP 141/91 | HR 106 | Temp 98.1°F | Resp 15 | Ht 64.0 in | Wt 179.0 lb

## 2015-10-09 DIAGNOSIS — I82402 Acute embolism and thrombosis of unspecified deep veins of left lower extremity: Secondary | ICD-10-CM | POA: Diagnosis not present

## 2015-10-09 DIAGNOSIS — Z8541 Personal history of malignant neoplasm of cervix uteri: Secondary | ICD-10-CM | POA: Diagnosis not present

## 2015-10-09 DIAGNOSIS — I82409 Acute embolism and thrombosis of unspecified deep veins of unspecified lower extremity: Secondary | ICD-10-CM | POA: Insufficient documentation

## 2015-10-09 DIAGNOSIS — J4520 Mild intermittent asthma, uncomplicated: Secondary | ICD-10-CM

## 2015-10-09 DIAGNOSIS — D509 Iron deficiency anemia, unspecified: Secondary | ICD-10-CM | POA: Diagnosis not present

## 2015-10-09 DIAGNOSIS — I10 Essential (primary) hypertension: Secondary | ICD-10-CM | POA: Diagnosis not present

## 2015-10-09 DIAGNOSIS — R1030 Lower abdominal pain, unspecified: Secondary | ICD-10-CM | POA: Diagnosis not present

## 2015-10-09 DIAGNOSIS — Z79899 Other long term (current) drug therapy: Secondary | ICD-10-CM | POA: Insufficient documentation

## 2015-10-09 DIAGNOSIS — C539 Malignant neoplasm of cervix uteri, unspecified: Secondary | ICD-10-CM

## 2015-10-09 MED ORDER — CYCLOBENZAPRINE HCL 10 MG PO TABS: 10.0000 mg | ORAL_TABLET | Freq: Two times a day (BID) | ORAL | Status: AC

## 2015-10-09 MED ORDER — OXYCODONE-ACETAMINOPHEN 5-325 MG PO TABS: 1.0000 | ORAL_TABLET | ORAL | Status: AC | PRN

## 2015-10-09 MED ORDER — FERROUS SULFATE 325 (65 FE) MG PO TABS: 325.0000 mg | ORAL_TABLET | Freq: Two times a day (BID) | ORAL | Status: AC

## 2015-10-09 MED ORDER — HYDROCHLOROTHIAZIDE 25 MG PO TABS: 25.0000 mg | ORAL_TABLET | Freq: Every day | ORAL | Status: AC

## 2015-10-09 MED ORDER — FUROSEMIDE 20 MG PO TABS: 20.0000 mg | ORAL_TABLET | Freq: Every day | ORAL | Status: AC

## 2015-10-09 NOTE — Progress Notes (Signed)
Patient complains of 10/10 left groin pain She sees oncology on the 19th She is asking for pain medication less strong than oxycodone

## 2015-10-09 NOTE — Patient Instructions (Signed)
Anemia, Nonspecific Anemia is a condition in which the concentration of red blood cells or hemoglobin in the blood is below normal. Hemoglobin is a substance in red blood cells that carries oxygen to the tissues of the body. Anemia results in not enough oxygen reaching these tissues.  CAUSES  Common causes of anemia include:   Excessive bleeding. Bleeding may be internal or external. This includes excessive bleeding from periods (in women) or from the intestine.   Poor nutrition.   Chronic kidney, thyroid, and liver disease.  Bone marrow disorders that decrease red blood cell production.  Cancer and treatments for cancer.  HIV, AIDS, and their treatments.  Spleen problems that increase red blood cell destruction.  Blood disorders.  Excess destruction of red blood cells due to infection, medicines, and autoimmune disorders. SIGNS AND SYMPTOMS   Minor weakness.   Dizziness.   Headache.  Palpitations.   Shortness of breath, especially with exercise.   Paleness.  Cold sensitivity.  Indigestion.  Nausea.  Difficulty sleeping.  Difficulty concentrating. Symptoms may occur suddenly or they may develop slowly.  DIAGNOSIS  Additional blood tests are often needed. These help your health care provider determine the best treatment. Your health care provider will check your stool for blood and look for other causes of blood loss.  TREATMENT  Treatment varies depending on the cause of the anemia. Treatment can include:   Supplements of iron, vitamin B12, or folic acid.   Hormone medicines.   A blood transfusion. This may be needed if blood loss is severe.   Hospitalization. This may be needed if there is significant continual blood loss.   Dietary changes.  Spleen removal. HOME CARE INSTRUCTIONS Keep all follow-up appointments. It often takes many weeks to correct anemia, and having your health care provider check on your condition and your response to  treatment is very important. SEEK IMMEDIATE MEDICAL CARE IF:   You develop extreme weakness, shortness of breath, or chest pain.   You become dizzy or have trouble concentrating.  You develop heavy vaginal bleeding.   You develop a rash.   You have bloody or black, tarry stools.   You faint.   You vomit up blood.   You vomit repeatedly.   You have abdominal pain.  You have a fever or persistent symptoms for more than 2-3 days.   You have a fever and your symptoms suddenly get worse.   You are dehydrated.  MAKE SURE YOU:  Understand these instructions.  Will watch your condition.  Will get help right away if you are not doing well or get worse.   This information is not intended to replace advice given to you by your health care provider. Make sure you discuss any questions you have with your health care provider.   Document Released: 07/31/2004 Document Revised: 02/23/2013 Document Reviewed: 12/17/2012 Elsevier Interactive Patient Education 2016 [employer]  

## 2015-10-09 NOTE — Progress Notes (Signed)
Subjective:  Patient ID: Erica Huffman, female    DOB: January 12, 1961  Age: 55 y.o. 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 Previously followed in [place] and relocated to [place] not long ago.  CT venogram revealed absence of clots and IVC filter but presence of thrombosis and stents extending from the external iliac veins into the IVC bilaterally, extensive venous collateralization bypasses this obstruction. 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 No intervention as per interventional radiology and patient was subsequently discharged with narcotics to follow-up with oncology.  She comes in today accompanied by her mother and son and complaints of severe pain 10/10 in her left groin which wakes her up at night. She also feels distends in her legs whenever she sits to use the bathroom. Request form for handicap placard.   Outpatient Prescriptions Prior to Visit  Medication Sig Dispense Refill  . acetaminophen (TYLENOL) 325 MG tablet Take 325 mg by mouth every 4 (four) hours as needed for moderate pain.     Marland Kitchen docusate sodium (COLACE) 100 MG capsule Take 100 mg by mouth 2 (two) times daily.    Marland Kitchen enoxaparin (LOVENOX) 80 MG/0.8ML injection Inject 0.8 mLs (80 mg total) into the skin every 12 (twelve) hours. 30 Syringe 2  . Multiple Vitamin (MULTIVITAMIN WITH MINERALS) TABS tablet Take 1 tablet by mouth daily.    . cyclobenzaprine  (FLEXERIL) 10 MG tablet Take 10 mg by mouth 2 (two) times daily.    Marland Kitchen oxyCODONE-acetaminophen (PERCOCET/ROXICET) 5-325 MG tablet Take 1-2 tablets by mouth every 4 (four) hours as needed for severe pain. 30 tablet 0  . albuterol (PROVENTIL HFA;VENTOLIN HFA) 108 (90 BASE) MCG/ACT inhaler Inhale 2 puffs into the lungs every 6 (six) hours as needed for wheezing or shortness of breath. Reported on 10/09/2015    . guaiFENesin (ROBITUSSIN) 100 MG/5ML liquid Take 200 mg by mouth 2 (two) times daily as needed for cough. Reported on 10/09/2015    . hydrochlorothiazide (HYDRODIURIL) 25 MG tablet Take 1 tablet (25 mg total) by mouth daily. (Patient not taking: Reported on 10/09/2015) 30 tablet 1  . polyethylene glycol (MIRALAX / GLYCOLAX) packet Take 17 g by mouth daily as needed for mild constipation or moderate constipation. Reported on 10/09/2015     No facility-administered medications prior to visit.    ROS Review of Systems  Constitutional: Positive for appetite change and fatigue. Negative for activity change.  HENT: Negative for congestion, sinus pressure and sore throat.   Eyes: Negative for visual disturbance.  Respiratory: Negative for cough, chest tightness, shortness of breath and wheezing.   Cardiovascular: Positive for leg swelling. Negative for chest pain and palpitations.  Gastrointestinal: Negative for abdominal pain, constipation and abdominal distention.  Endocrine: Negative for polydipsia.  Genitourinary: Negative for dysuria and frequency.  Musculoskeletal: Negative for back pain and arthralgias.  Skin: Negative for rash.  Neurological: Positive for weakness. Negative for tremors, light-headedness and numbness.  Hematological: Does not bruise/bleed easily.  Psychiatric/Behavioral: Negative for behavioral problems and  agitation.    Objective:  BP 141/91 mmHg  Pulse 106  Temp(Src) 98.1 F (36.7 C)  Resp 15  Ht 5\' 4"  (1.626 m)  Wt 179 lb (81.194 kg)  BMI 30.71 kg/m2  SpO2  100%  BP/Weight 10/09/2015 10/04/2015 [id number]  Systolic BP [id number] -  Diastolic BP 91 78 -  Wt. (Lbs) 179 - 179.1  BMI 30.71 - 30.73      Physical Exam  Constitutional: She is oriented to person, place, and time. She appears well-developed and well-nourished.  Chronically ill looking, in pain  Cardiovascular: Normal rate, normal heart sounds and intact distal pulses.   No murmur heard. Pulmonary/Chest: Effort normal and breath sounds normal. She has no wheezes. She has no rales. She exhibits no tenderness.  Abdominal: Soft. Bowel sounds are normal. She exhibits mass (knots on abdomen from the site of Lovenox injection). She exhibits no distension. There is no tenderness.  Musculoskeletal: Normal range of motion. She exhibits edema (nonpitting edema bilaterally from ankle to groin, left greater than right).  Neurological: She is alert and oriented to person, place, and time.     CLINICAL DATA: 55 year old female with history of cervical cancer and peritoneal carcinomatosis. History of IVC filter. Increasing lower leg pain. Evaluate for thrombosis.  EXAM: CT ABDOMEN AND PELVIS WITH CONTRAST  TECHNIQUE: Multidetector CT imaging of the abdomen and pelvis was performed using the standard protocol following bolus administration of intravenous contrast.  CONTRAST: 148mL ISOVUE-300 IOPAMIDOL (ISOVUE-300) INJECTION 61%  COMPARISON: CT the abdomen and pelvis 08/15/2010.  FINDINGS: Lower chest: Unremarkable.  Hepatobiliary: 5.2 x 4.5 cm hypovascular mass in the posterior aspect of the right lobe of the liver involving both segments 6 in segments 7, presumably a metastatic lesion. No intra or extrahepatic biliary ductal dilatation. Status post cholecystectomy.  Pancreas: No pancreatic mass. No pancreatic ductal dilatation. Extensive peripancreatic fluid.  Spleen: There are 2 hypovascular areas near the hilum of the spleen which persist on delayed phase imaging,  suspicious for metastatic lesions, the largest of which measures up to 1.6 cm (image 24 of series 2).  Adrenals/Urinary Tract: 11 mm simple cyst in the upper pole of the right kidney. Left kidney is normal in appearance. Mild left-sided hydroureteronephrosis. No right-sided hydroureteronephrosis. Urinary bladder appears mildly thickened cyst, but is partially decompressed. Bilateral adrenal glands are normal in appearance.  Stomach/Bowel: The appearance of the stomach is normal. There is no pathologic dilatation of small bowel or colon.  Vascular/Lymphatic: IVC filter in position with tip terminating at the level of the renal veins. There does not appear to be thrombus within the IVC filter itself. However, there are long stents beneath the filter which extend from the external iliac veins bilaterally into the inferior vena cava, and both of these stents appear to be nearly (minimal flow in a short portion of the right stent) completely occluded. There is extensive collateralization around the stents communicating with the inferior vena cava at the level of the IVC filter, much of which appears to be coming from dilated right gonadal veins (multiple varices are noted). Additionally, there are numerous collaterals throughout the anterior abdominal wall. Mildly enlarged retroperitoneal lymph node in the left para-aortic nodal station measuring 1.2 cm in short axis. Soft tissue along the left pelvic side wall suspicious for left external iliac lymphadenopathy measuring up to 1.5 cm in diameter (image 69 of series 2), partially inseparable from the process in the left adnexal region.  Reproductive: Uterus is again markedly enlarged and heterogeneous  in appearance, including what appears to be a large fibroid in the posterior aspect of the uterus measuring 6.0 x 9.2 x 9.2 cm. Large amount of fluid in the endometrial canal, measuring up to 3.2 cm in thickness in the fundus. Lower  uterine segment and cervical region is grossly unremarkable in appearance. The ovaries are markedly enlarged bilaterally, measuring 8.7 x 8.8 x 8.7 cm on the right and 8.0 x 8.0 x 10.0 cm on the left. These ovaries are predominantly centrally low-attenuation, but demonstrate thick peripheral enhancement and some thick internal septations and nodularity, the overall appearance is most suggestive of neoplasm.  Other: Moderate volume of ascites. Widespread nodularity throughout the peritoneal cavity, with peritoneal implants measuring up to 2.9 x 2.2 cm (image 51 of series 2) associated with the transverse colon. No pneumoperitoneum.  Musculoskeletal: No aggressive appearing lytic or blastic lesions are noted in the visualized portions of the skeleton.  IMPRESSION: 1. Although there is no clot associated with the patient's IVC filter, the long segment stents which extend from the external iliac veins into the IVC bilaterally are both thrombosed. Extensive venous collateralization bypasses this obstruction, as discussed above. 2. 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 In addition, there is some left pelvic sidewall and retroperitoneal lymphadenopathy, 2 suspicious splenic lesions, as well as a large hypovascular metastatic lesion in the right lobe of the liver, as discussed above. 3. Markedly enlarged ovaries bilaterally, the appearance of which is most typical for primary ovarian neoplasm. While this may relate seen the underlying metastatic disease to the ovaries, the development of a secondary neoplasm is not excluded, and clinical correlation is suggested. 4. Large amount of fluid in the endometrial canal, suggesting post treatment related cervical stenosis. 5. Large lesion in the posterior aspect of the uterine fundus favored to represent a large fibroid, as above. 6. Additional incidental findings, as above. These results were called by telephone  at the time of interpretation on 10/02/2015 at 10:03 pm to Dr. Donnald Garre, who verbally acknowledged these results.   Electronically Signed  By: Vinnie Langton M.D.  On: 10/02/2015 22:10       Assessment & Plan:   1. Deep vein thrombosis (DVT) of left lower extremity, unspecified chronicity, unspecified vein (HCC) Status post IVC filter and stents On chronic Lovenox  2. Asthma, mild intermittent, uncomplicated Stable no evidence of acute exacerbation  3. Microcytic anemia Malignancy-induced- chronic Last hemoglobin was 7.7 Placed on ferrous sulfate; Colace for constipation prophylaxis  4. Groin pain, unspecified laterality Likely due to edema Added Lasix to regimen Will need to check labs at next visit for hypokalemia.  5. Cervical cancer With metastasis which could explain groin and lower extremity edema Scheduled to see oncology on 10/24/14; patient knows to keep calling in the event that there is an opening LCSW called to see patient to provide information as she would need to apply for [place] Medicaid as [place]  6. Hypertension Controlled   Meds ordered this encounter  Medications  . cyclobenzaprine (FLEXERIL) 10 MG tablet    Sig: Take 1 tablet (10 mg total) by mouth 2 (two) times daily.    Dispense:  60 tablet    Refill:  1  . oxyCODONE-acetaminophen (PERCOCET/ROXICET) 5-325 MG tablet    Sig: Take 1-2 tablets by mouth every 4 (four) hours as needed for severe pain.    Dispense:  30 tablet    Refill:  0  . furosemide (LASIX) 20 MG  tablet    Sig: Take 1 tablet (20 mg total) by mouth daily.    Dispense:  30 tablet    Refill:  1  . ferrous sulfate 325 (65 FE) MG tablet    Sig: Take 1 tablet (325 mg total) by mouth 2 (two) times daily with a meal.    Dispense:  60 tablet    Refill:  3    Follow-up: Return in about 3 weeks (around 10/30/2015) for Follow-up on pedal edema.   Arnoldo Morale MD

## 2015-10-12 ENCOUNTER — Inpatient Hospital Stay: Payer: Medicaid - Out of State | Admitting: Family Medicine

## 2015-10-17 ENCOUNTER — Telehealth: Payer: Self-pay

## 2015-10-17 NOTE — Telephone Encounter (Signed)
Patient called to request a prescription for her saliva, patient stated that she is not able to swallow and eat her food well because she has a dry mouth. Patient has tried over the counter medication without any help Please f/u with pt.

## 2015-10-24 ENCOUNTER — Emergency Department (HOSPITAL_COMMUNITY)
Admission: EM | Admit: 2015-10-24 | Discharge: 2015-10-24 | Disposition: A | Discharge status: Home / Self Care | Payer: Medicaid - Out of State | Attending: Emergency Medicine | Admitting: Emergency Medicine

## 2015-10-24 ENCOUNTER — Ambulatory Visit: Payer: Medicaid - Out of State | Attending: Gynecologic Oncology | Admitting: Gynecologic Oncology

## 2015-10-24 ENCOUNTER — Encounter (HOSPITAL_COMMUNITY): Payer: Self-pay | Admitting: *Deleted

## 2015-10-24 ENCOUNTER — Encounter: Payer: Self-pay | Admitting: Gynecologic Oncology

## 2015-10-24 VITALS — BP 148/85 | HR 100 | Temp 97.7°F | Resp 18 | Ht 64.0 in | Wt 181.3 lb

## 2015-10-24 DIAGNOSIS — D649 Anemia, unspecified: Secondary | ICD-10-CM | POA: Diagnosis not present

## 2015-10-24 DIAGNOSIS — I82509 Chronic embolism and thrombosis of unspecified deep veins of unspecified lower extremity: Secondary | ICD-10-CM | POA: Diagnosis present

## 2015-10-24 DIAGNOSIS — K219 Gastro-esophageal reflux disease without esophagitis: Secondary | ICD-10-CM | POA: Insufficient documentation

## 2015-10-24 DIAGNOSIS — C539 Malignant neoplasm of cervix uteri, unspecified: Secondary | ICD-10-CM

## 2015-10-24 DIAGNOSIS — I825Z3 Chronic embolism and thrombosis of unspecified deep veins of distal lower extremity, bilateral: Secondary | ICD-10-CM | POA: Diagnosis not present

## 2015-10-24 DIAGNOSIS — C772 Secondary and unspecified malignant neoplasm of intra-abdominal lymph nodes: Secondary | ICD-10-CM | POA: Diagnosis not present

## 2015-10-24 DIAGNOSIS — D509 Iron deficiency anemia, unspecified: Secondary | ICD-10-CM

## 2015-10-24 DIAGNOSIS — C7889 Secondary malignant neoplasm of other digestive organs: Secondary | ICD-10-CM | POA: Insufficient documentation

## 2015-10-24 DIAGNOSIS — R1013 Epigastric pain: Secondary | ICD-10-CM | POA: Diagnosis not present

## 2015-10-24 DIAGNOSIS — G8929 Other chronic pain: Secondary | ICD-10-CM | POA: Diagnosis not present

## 2015-10-24 DIAGNOSIS — Z7951 Long term (current) use of inhaled steroids: Secondary | ICD-10-CM | POA: Diagnosis not present

## 2015-10-24 DIAGNOSIS — R102 Pelvic and perineal pain: Secondary | ICD-10-CM | POA: Diagnosis not present

## 2015-10-24 DIAGNOSIS — Z87891 Personal history of nicotine dependence: Secondary | ICD-10-CM | POA: Diagnosis not present

## 2015-10-24 DIAGNOSIS — J45909 Unspecified asthma, uncomplicated: Secondary | ICD-10-CM | POA: Insufficient documentation

## 2015-10-24 DIAGNOSIS — R531 Weakness: Secondary | ICD-10-CM | POA: Diagnosis not present

## 2015-10-24 DIAGNOSIS — C787 Secondary malignant neoplasm of liver and intrahepatic bile duct: Secondary | ICD-10-CM | POA: Diagnosis not present

## 2015-10-24 DIAGNOSIS — E43 Unspecified severe protein-calorie malnutrition: Secondary | ICD-10-CM | POA: Insufficient documentation

## 2015-10-24 DIAGNOSIS — I10 Essential (primary) hypertension: Secondary | ICD-10-CM | POA: Diagnosis not present

## 2015-10-24 DIAGNOSIS — R000 Tachycardia, unspecified: Secondary | ICD-10-CM | POA: Insufficient documentation

## 2015-10-24 DIAGNOSIS — R188 Other ascites: Secondary | ICD-10-CM | POA: Insufficient documentation

## 2015-10-24 DIAGNOSIS — R112 Nausea with vomiting, unspecified: Secondary | ICD-10-CM | POA: Diagnosis not present

## 2015-10-24 DIAGNOSIS — D259 Leiomyoma of uterus, unspecified: Secondary | ICD-10-CM | POA: Insufficient documentation

## 2015-10-24 DIAGNOSIS — Z8541 Personal history of malignant neoplasm of cervix uteri: Secondary | ICD-10-CM | POA: Insufficient documentation

## 2015-10-24 DIAGNOSIS — Z7901 Long term (current) use of anticoagulants: Secondary | ICD-10-CM | POA: Diagnosis not present

## 2015-10-24 DIAGNOSIS — E871 Hypo-osmolality and hyponatremia: Secondary | ICD-10-CM

## 2015-10-24 DIAGNOSIS — Z79891 Long term (current) use of opiate analgesic: Secondary | ICD-10-CM | POA: Diagnosis not present

## 2015-10-24 DIAGNOSIS — I82523 Chronic embolism and thrombosis of iliac vein, bilateral: Secondary | ICD-10-CM

## 2015-10-24 LAB — CBC WITH DIFFERENTIAL/PLATELET
BASOS ABS: 0 10*3/uL (ref 0.0–0.1)
BASOS PCT: 0 %
EOS ABS: 0 10*3/uL (ref 0.0–0.7)
Eosinophils Relative: 0 %
HEMATOCRIT: 27.5 % — AB (ref 36.0–46.0)
HEMOGLOBIN: 8.5 g/dL — AB (ref 12.0–15.0)
LYMPHS PCT: 8 %
Lymphs Abs: 0.6 10*3/uL — ABNORMAL LOW (ref 0.7–4.0)
MCH: 20.5 pg — ABNORMAL LOW (ref 26.0–34.0)
MCHC: 30.9 g/dL (ref 30.0–36.0)
MCV: 66.4 fL — ABNORMAL LOW (ref 78.0–100.0)
MONOS PCT: 9 %
Monocytes Absolute: 0.7 10*3/uL (ref 0.1–1.0)
NEUTROS ABS: 6.3 10*3/uL (ref 1.7–7.7)
NEUTROS PCT: 83 %
Platelets: 568 10*3/uL — ABNORMAL HIGH (ref 150–400)
RBC: 4.14 MIL/uL (ref 3.87–5.11)
RDW: 19.6 % — ABNORMAL HIGH (ref 11.5–15.5)
WBC: 7.6 10*3/uL (ref 4.0–10.5)

## 2015-10-24 LAB — URINALYSIS, ROUTINE W REFLEX MICROSCOPIC
BILIRUBIN URINE: NEGATIVE
Glucose, UA: NEGATIVE mg/dL
Hgb urine dipstick: NEGATIVE
Ketones, ur: NEGATIVE mg/dL
Leukocytes, UA: NEGATIVE
NITRITE: NEGATIVE
PH: 6.5 (ref 5.0–8.0)
Protein, ur: NEGATIVE mg/dL
SPECIFIC GRAVITY, URINE: 1.003 — AB (ref 1.005–1.030)

## 2015-10-24 LAB — COMPREHENSIVE METABOLIC PANEL
ALK PHOS: 115 U/L (ref 38–126)
ALT: 12 U/L — AB (ref 14–54)
ANION GAP: 16 — AB (ref 5–15)
AST: 19 U/L (ref 15–41)
Albumin: 3 g/dL — ABNORMAL LOW (ref 3.5–5.0)
BILIRUBIN TOTAL: 0.5 mg/dL (ref 0.3–1.2)
BUN: 11 mg/dL (ref 6–20)
CO2: 25 mmol/L (ref 22–32)
Calcium: 9.3 mg/dL (ref 8.9–10.3)
Chloride: 88 mmol/L — ABNORMAL LOW (ref 101–111)
Creatinine, Ser: 1.17 mg/dL — ABNORMAL HIGH (ref 0.44–1.00)
GFR calc non Af Amer: 52 mL/min — ABNORMAL LOW (ref >60)
GLUCOSE: 122 mg/dL — AB (ref 65–99)
POTASSIUM: 3.1 mmol/L — AB (ref 3.5–5.1)
Sodium: 129 mmol/L — ABNORMAL LOW (ref 135–145)
TOTAL PROTEIN: 8.2 g/dL — AB (ref 6.5–8.1)

## 2015-10-24 MED ORDER — HYDROMORPHONE HCL 1 MG/ML IJ SOLN
1.0000 mg | Freq: Once | INTRAMUSCULAR | Status: AC
  Administered 2015-10-24: 1 mg via INTRAVENOUS
  Filled 2015-10-24: qty 1

## 2015-10-24 MED ORDER — ONDANSETRON HCL 4 MG PO TABS
4.0000 mg | ORAL_TABLET | Freq: Four times a day (QID) | ORAL | Status: AC
Start: 2015-10-24 — End: ?

## 2015-10-24 MED ORDER — HYDROMORPHONE HCL 2 MG/ML IJ SOLN
2.0000 mg | Freq: Once | INTRAMUSCULAR | Status: AC
  Administered 2015-10-24: 2 mg via INTRAVENOUS
  Filled 2015-10-24: qty 1

## 2015-10-24 MED ORDER — ONDANSETRON HCL 4 MG/2ML IJ SOLN
4.0000 mg | Freq: Once | INTRAMUSCULAR | Status: AC
  Administered 2015-10-24: 4 mg via INTRAVENOUS
  Filled 2015-10-24: qty 2

## 2015-10-24 MED ORDER — SODIUM CHLORIDE 0.9 % IV BOLUS (SEPSIS)
1000.0000 mL | Freq: Once | INTRAVENOUS | Status: AC
  Administered 2015-10-24: 1000 mL via INTRAVENOUS

## 2015-10-24 MED ORDER — OXYCODONE HCL 10 MG PO TABS: 10.0000 mg | ORAL_TABLET | ORAL | Status: AC | PRN

## 2015-10-24 MED ORDER — OXYCODONE-ACETAMINOPHEN 5-325 MG PO TABS
2.0000 | ORAL_TABLET | Freq: Once | ORAL | Status: AC
  Administered 2015-10-24: 1 via ORAL
  Filled 2015-10-24: qty 2

## 2015-10-24 MED ORDER — OXYCODONE HCL ER 10 MG PO T12A: 10.0000 mg | EXTENDED_RELEASE_TABLET | Freq: Two times a day (BID) | ORAL | Status: DC

## 2015-10-24 MED ORDER — LORAZEPAM 0.5 MG PO TABS: 0.5000 mg | ORAL_TABLET | Freq: Four times a day (QID) | ORAL | Status: AC | PRN

## 2015-10-24 NOTE — Progress Notes (Signed)
New Patient Evaluation Note: Gyn-Onc    [name] 55 y.o. female  CC:  Chief Complaint  Patient presents with  . Cervical Cancer    New Consultation    Assessment/Plan:  Ms. Erica Huffman  is a 55 y.o.  year old with widely metastatic terminal cervical cancer. She has relocated to [facility] in March of 2017 after her treating physicians in [place] told her there was nothing they could offer her. Her Mom is here in [place].  I spent >60 minutes with Erica Huffman and her mother discussing her cancer diagnosis, reviewing her CT images with her and discussing prognosis and options, palliative care and end of life planning.   I discussed that there is no cure for her disease. There are two options for management 1/ palliative chemotherapy or 2/ hospice care. Because her performance status is so poor (ECOG 3) I do not believe she is a good candidate for palliative chemotherapy as she would not tolerate therapy. Therefore I am recommending Hospice care. We have initiated this.  With respect to symptom management:  1/ pain in pelvis and leg - prescribed oxycontin 10 BID and oxycodone 10 q 4 hours prn 2/ dyspepsia and abdominal fullness - from ascites. Could consider therapeutic paracentesis if progressive 3/ low back skin irritation - appears to be developing decubitous ulcer. Provided patient with topical dressing to prevent progression 4/ severe malnutrition - recommended high protein input 5/ anxiety from diagnosis of terminal cancer - prescribed ativan 0.5mg  prn 6/ terminal cancer - discussed DNR with patient and her mother who consent.   HPI: Erica Huffman is a 55 year old woman who presents as a new consultation, self-referred for transfer of care for advanced cervical cancer.  We are somewhat limited in our ability to document her history due to the patient's poor ability to report her oncologic history and limited availability of her records from [place]. However in  review of the records I have available it appears that she was diagnosed with a stage IIB cervical cancer in November 2015 in [place] where she was taking care of at [facility]. It is unclear to me what cell type her cervical cancer was. Pre-treatment PET/CT from 05/11/2014 revealed a distended endometrial cavity, an extensive focus of abnormal hypermetabolism with maximum SUV of 13 associated with an amorphous mass centered at the level of the cervix measuring 6.7 cm in AP diameter and 5.8 cm in transverse diameter. It was 6 cm in craniocaudal length. The superior extent of the malignancy extended to the level of the lower uterine segment. The mass extends to and probably involves the superior vaginal vault. There is infiltration in the immediately adjacent paracervical and lower parauterine soft tissues. There is cystic appearance of both ovaries. Hypermetabolic lymph nodes consistent with metastatic disease were identified at the right common iliac bifurcation and the left common iliac bifurcation. There is also an enlarged right common iliac lymph node. There was no obvious metastatic adenopathy above the level of the aortic bifurcation.  She went on to receive primary chemoradiation therapy which appears was completed in approximately March to April 2016. The patient reports that she did not have treatment delays during that time. I have the results of the PET/CT performed on 01/19/2015 as response to a posttreatment CT scan which is identified enlarging lymph nodes. This PET/CT from 01/19/2015 revealed reduction in the volume of soft tissue density at the cervix and improvement in the suspicious lymph nodes  of the pelvis consistent with positive response to disease. However there was a suspicious left periaortic lymph node at the level of the aortic bifurcation measuring 1.2 cm with increased focal uptake suspicious for progression of nodal metastases. There were no other signs of metastatic  disease. The patient reports that she was not informed that she had progression of disease and there was no plan for additional treatment that she was aware of. In the fall 2016 she returned to [facility] to visit her family. At that time she was diagnosed with a severe DVT in the left lower extremity was treated with an IVC filter placement. She returns to [place] and in January and February 2017 was admitted to the hospital for management of her VTE disease which included placement of venous stents.  It appears that during that timeframe she may have changed GYN oncology providers. Upon deeper questioning she was told by her providers that her disease was progressing and she was dying from her disease. No additional therapy was offered to her.  In late March 2017 she returned to [facility] to live with her mother understand that she needed more complex care with the hope that she might change providers here to [place]. Prior to being a little we seen by GYN oncologists she experienced progressive left lower extremity edema and pain and was evaluated in the emergency room here in [place]. CT scan imaging of the abdomen and pelvis with a venogram revealed the long segment stents which extend from the external iliac veins into the IVC bilaterally are both thrombosed. Extensive venous collateralization bypasses this obstruction. 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 In addition, there is some left pelvic sidewall and retroperitoneal lymphadenopathy, 2 suspicious splenic lesions, as well as a large hypovascular metastatic lesion in the right lobe of the liver, as discussed above. Markedly enlarged ovaries bilaterally, the appearance of which is most typical for primary ovarian neoplasm.   These findings were consistent with dramatic progression and wide spread metastases of her cervical cancer.  Interval History: she has been feeling  progressively weak. She has trouble eating and reflux. She has had progressive weight loss. She only spends approximately 2 hours out of bed/chair per day. She is short of breath at rest. She has constant severe pain in her very distended and edematous LLE. She has pain at her sacrum and skin irritation overlying this. She denies vaginal bleeding.  Current Meds:  Outpatient Encounter Prescriptions as of 10/24/2015  Medication Sig  . acetaminophen (TYLENOL) 325 MG tablet Take 325 mg by mouth every 4 (four) hours as needed for moderate pain.   Marland Kitchen albuterol (PROVENTIL HFA;VENTOLIN HFA) 108 (90 BASE) MCG/ACT inhaler Inhale 2 puffs into the lungs every 6 (six) hours as needed for wheezing or shortness of breath. Reported on 10/09/2015  . cyclobenzaprine (FLEXERIL) 10 MG tablet Take 1 tablet (10 mg total) by mouth 2 (two) times daily. (Patient taking differently: Take 10 mg by mouth 2 (two) times daily as needed for muscle spasms. )  . docusate sodium (COLACE) 100 MG capsule Take 100 mg by mouth 2 (two) times daily.  Marland Kitchen enoxaparin (LOVENOX) 80 MG/0.8ML injection Inject 0.8 mLs (80 mg total) into the skin every 12 (twelve) hours.  . ferrous sulfate 325 (65 FE) MG tablet Take 1 tablet (325 mg total) by mouth 2 (two) times daily with a meal.  . hydrochlorothiazide (HYDRODIURIL) 25 MG tablet Take 1 tablet (25 mg total) by mouth daily.  Marland Kitchen  LORazepam (ATIVAN) 0.5 MG tablet Take 0.5 mg by mouth every 6 (six) hours as needed for anxiety.  . ondansetron (ZOFRAN) 4 MG tablet Take 1 tablet (4 mg total) by mouth every 6 (six) hours.  Marland Kitchen oxyCODONE-acetaminophen (PERCOCET/ROXICET) 5-325 MG tablet Take 1-2 tablets by mouth every 4 (four) hours as needed for severe pain.  . polyethylene glycol (MIRALAX / GLYCOLAX) packet Take 17 g by mouth daily as needed for mild constipation or moderate constipation. Reported on 10/09/2015  . furosemide (LASIX) 20 MG tablet Take 1 tablet (20 mg total) by mouth daily. (Patient not taking:  Reported on 10/24/2015)  . LORazepam (ATIVAN) 0.5 MG tablet Take 1 tablet (0.5 mg total) by mouth every 6 (six) hours as needed for anxiety.  Marland Kitchen oxyCODONE (OXYCONTIN) 10 mg 12 hr tablet Take 1 tablet (10 mg total) by mouth every 12 (twelve) hours.  . Oxycodone HCl 10 MG TABS Take 1 tablet (10 mg total) by mouth every 4 (four) hours as needed.   No facility-administered encounter medications on file as of 10/24/2015.    Allergy:  Allergies  Allergen Reactions  . Shellfish Allergy Anaphylaxis and Swelling    Social Hx:   Social History   Social History  . Marital Status: Single    Spouse Name: N/A  . Number of Children: N/A  . Years of Education: N/A   Occupational History  . Not on file.   Social History Main Topics  . Smoking status: Former Research scientist (life sciences)  . Smokeless tobacco: Not on file  . Alcohol Use: No  . Drug Use: No  . Sexual Activity: Not on file   Other Topics Concern  . Not on file   Social History Narrative    Past Surgical Hx:  Past Surgical History  Procedure Laterality Date  . Ectopic pregnancy surgery    . Cholecystectomy    . Fracture surgery      Past Medical Hx:  Past Medical History  Diagnosis Date  . Cancer ([place])   . Cervical cancer ([place])   . Asthma   . Hypertension   . Fibroid     Past Gynecological History:  G0  No LMP recorded. Patient is not currently having periods (Reason: Chemotherapy).  Family Hx:  Family History  Problem Relation Age of Onset  . Hypertension Mother     Review of Systems:  Constitutional  Feels fatigued and unwell. In pain  ENT Normal appearing ears and nares bilaterally Skin/Breast  + sacral irritation Cardiovascular  + shortness of breath, + LLE edema+++  Pulmonary  + SOB Gastro Intestinal  Difficulty eating, reflux Genito Urinary  No frequency, urgency, dysuria,  Musculo Skeletal  + pain  Neurologic  +weakness, + fogginess in her head Psychology  + depression, + anxiety, insomnia.   Vitals:   Blood pressure 148/85, pulse 100, temperature 97.7 F (36.5 C), temperature source Oral, resp. rate 18, height 5\' 4"  (1.626 m), weight 181 lb 4.8 oz (82.237 kg).  Physical Exam: Sunken facies/cheeks and eye sockets. Neck  Supple NROM, without any enlargements.  Lymph Node Survey No cervical supraclavicular or inguinal adenopathy Cardiovascular  Pulse normal rate, regularity and rhythm. S1 and S2 normal.  Lungs  Clear to auscultation bilateraly, without wheezes/crackles/rhonchi. fair air movement.  Skin  Edematous and slightly erythematous skin overlying sacrum consistent with stage I decub ulcer Psychiatry  Alert and oriented to person, place, and time  Abdomen  + distended, dull to percuss with ascites, neovascularlization/veins from central DVT  Back  No CVA tenderness Genito Urinary  Vulva/vagina: deferred Rectal  deferred  Extremities  Gross 3+ edema in left LE which is tense and taught with edema. Well perfused foot.  60 minutes of face to face counseling time was spent with the patient to discuss testing results, review imaging, review prognosis, discuss end of life care (hospice and DNR), and symptom management. Additional time was also spent to obtain and review outside imaging and films and outside physician records.  Donaciano Eva, MD  10/24/2015, 1:31 PM

## 2015-10-24 NOTE — ED Provider Notes (Signed)
CSN: [id number]     Arrival date & time 10/24/15  0432 History   First MD Initiated Contact with Patient 10/24/15 [phone]     Chief Complaint  Patient presents with  . DVT  . Fluid Retention      (Consider location/radiation/quality/duration/timing/severity/associated sxs/prior Treatment) The history is provided by the patient.  PT HAS A HX OF CHRONIC PAIN SECONDARY TO DVT FROM METASTATIC CERVICAL CANCER.  SHE CONTINUES TO C/O PAIN.  SHE WAS HERE ON 3/28 AND WAS ADMITTED TO [place] UNTIL 3/30.  WHILE HERE, PT'S IVC FILTER WAS DETERMINED TO BE PATENT.  IR DID NOT FEEL LIKE THERE WAS ANY INTERVENTION NEEDED.  THE PT DID F/U WITH A PCP ON 4/4 AND WANTED MORE PAIN MEDS WHICH SHE WAS GIVEN.  SHE SAID THAT THE PAIN MEDS ARE NOT HELPING AND SHE HAS BEEN VOMITING.  THE PT HAS AN APPT WITH ONCOLOGY TODAY AT 1100.  Past Medical History  Diagnosis Date  . Cancer ([place])   . Cervical cancer ([place])   . Asthma   . Hypertension   . Fibroid    Past Surgical History  Procedure Laterality Date  . Ectopic pregnancy surgery    . Cholecystectomy    . Fracture surgery     Family History  Problem Relation Age of Onset  . Hypertension Mother    Social History  Substance Use Topics  . Smoking status: Former Research scientist (life sciences)  . Smokeless tobacco: None  . Alcohol Use: No   OB History    No data available     Review of Systems  Constitutional: Positive for fatigue.  Gastrointestinal: Positive for nausea and vomiting.      Allergies  Shellfish allergy  Home Medications   Prior to Admission medications   Medication Sig Start Date End Date Taking? Authorizing Provider  acetaminophen (TYLENOL) 325 MG tablet Take 325 mg by mouth every 4 (four) hours as needed for moderate pain.     Historical Provider, MD  albuterol (PROVENTIL HFA;VENTOLIN HFA) 108 (90 BASE) MCG/ACT inhaler Inhale 2 puffs into the lungs every 6 (six) hours as needed for wheezing or shortness of breath. Reported on 10/09/2015    Historical  Provider, MD  cyclobenzaprine (FLEXERIL) 10 MG tablet Take 1 tablet (10 mg total) by mouth 2 (two) times daily. 10/09/15   Arnoldo Morale, MD  docusate sodium (COLACE) 100 MG capsule Take 100 mg by mouth 2 (two) times daily.    Historical Provider, MD  enoxaparin (LOVENOX) 80 MG/0.8ML injection Inject 0.8 mLs (80 mg total) into the skin every 12 (twelve) hours. 10/04/15   Charlynne Cousins, MD  ferrous sulfate 325 (65 FE) MG tablet Take 1 tablet (325 mg total) by mouth 2 (two) times daily with a meal. 10/09/15   Arnoldo Morale, MD  furosemide (LASIX) 20 MG tablet Take 1 tablet (20 mg total) by mouth daily. 10/09/15   Arnoldo Morale, MD  guaiFENesin (ROBITUSSIN) 100 MG/5ML liquid Take 200 mg by mouth 2 (two) times daily as needed for cough. Reported on 10/09/2015    Historical Provider, MD  hydrochlorothiazide (HYDRODIURIL) 25 MG tablet Take 1 tablet (25 mg total) by mouth daily. 10/09/15   Arnoldo Morale, MD  Multiple Vitamin (MULTIVITAMIN WITH MINERALS) TABS tablet Take 1 tablet by mouth daily.    Historical Provider, MD  oxyCODONE-acetaminophen (PERCOCET/ROXICET) 5-325 MG tablet Take 1-2 tablets by mouth every 4 (four) hours as needed for severe pain. 10/09/15   Arnoldo Morale, MD  polyethylene glycol (MIRALAX /  GLYCOLAX) packet Take 17 g by mouth daily as needed for mild constipation or moderate constipation. Reported on 10/09/2015    Historical Provider, MD   BP 141/89 mmHg  Pulse 104  Temp(Src) 97.8 F (36.6 C) (Oral)  Resp 20  SpO2 99% Physical Exam  Constitutional: She is oriented to person, place, and time. She appears distressed.  PT APPEARS CHRONICALLY ILL  HENT:  Head: Normocephalic and atraumatic.  Eyes: Conjunctivae and EOM are normal. Pupils are equal, round, and reactive to light.  Neck: Normal range of motion. Neck supple.  Cardiovascular: Regular rhythm and normal heart sounds.  Tachycardia present.   TACHYCARDIA  Pulmonary/Chest: Effort normal and breath sounds normal.  Abdominal: Soft. Bowel  sounds are normal.  Musculoskeletal: Normal range of motion. She exhibits edema.  BILATERAL LEGS WITH NONPITTING EDEMA.  LEFT MORE SWOLLEN THAN RIGHT.  Neurological: She is alert and oriented to person, place, and time.  Skin: Skin is warm and dry.  Psychiatric:  ANXIOUS    ED Course  Procedures (including critical care time) Labs Review Labs Reviewed  COMPREHENSIVE METABOLIC PANEL  CBC WITH DIFFERENTIAL/PLATELET  URINALYSIS, ROUTINE W REFLEX MICROSCOPIC (NOT AT [facility])    Imaging Review No results found. I have personally reviewed and evaluated these images and lab results as part of my medical decision-making.   EKG Interpretation None      MDM  PT STILL HAS PAIN, BUT I DON'T THINK WE WILL BE ABLE TO GET RID OF THAT PAIN HERE.  THE PT HAS AN APPT IN A LITTLE OVER AN HOUR WITH THE ONCOLOGIST, AND THAT IS IMPORTANT FOR PT TO GET TO.  PT TOLD CHRONIC PAIN CONTROL NEEDS TO BE DONE THROUGH PCP OR ONCOLOGIST. Final diagnoses:  None   CHRONIC PAIN CHRONIC ANEMIA NAUSEA AND VOMITING CERVICAL CANCER CHRONIC DVT CHRONIC EDEMA DUE TO DVT    Isla Pence, MD 10/24/15 [phone]

## 2015-10-24 NOTE — ED Notes (Signed)
Discharge instructions, follow up care, and rx x1 reviewed with patient. Patient verbalized understanding. 

## 2015-10-24 NOTE — ED Notes (Signed)
PT states that she was here and was in the hospital for several days; pt states that she was diagnosed with DVT to the rt leg and states that she has stents to that leg that are "blocked"; pt states that they were supposed to do surgery when she was here but they decided not to; pt states that she has fluid retention to her abdomen; pt states that the vessels in her leg and abdomen are dilated from it being blocked

## 2015-10-24 NOTE — ED Notes (Addendum)
Patient went to bathroom. Patient urinated without obtaining urine specimen. Patient encouraged to let staff know when she can provide another urine sample.

## 2015-10-24 NOTE — Patient Instructions (Signed)
You will be contacted by [facility] to arrange for them to come to your home.  Begin taking oxycontin 10 mg twice daily and the oxycodone immediate release every four hours as needed.  Please call our office at [phone] for any needs or concerns.

## 2015-10-25 ENCOUNTER — Telehealth: Payer: Self-pay

## 2015-10-25 ENCOUNTER — Encounter: Payer: Self-pay | Admitting: Gynecologic Oncology

## 2015-10-25 NOTE — Progress Notes (Signed)
Patient left mess she needs prior auth for her meds. Contact ph#s [phone] and [phone]. I sent message to Erica Huffman

## 2015-10-25 NOTE — Telephone Encounter (Signed)
Patient called to update Dr Everitt Amber that her prescription for Oxycontin 10 MG PO every 12 hours for pain was denied and requires Prior Authorization. [facility] was contacted as we did not receive the Prior Authorization form request. Correct fax [facility] was provided to [facility] , PA request form was received and the PA process was initiated. [place] [phone] , spoke with Darren who transferred me to the [facility] department , patient's ID # [id number] , Group # [id number] . Writer inform that an Appeal process is required for this "non-formulary" medication , the process was initiated. Alterative approved medications were discuss until the PA completed. Alterative medications are: Morphine Sulfate or a Fentanyl Patch . Dr Everitt Amber to be updated with the above notation and recommendations for alterative narcotic.That patient was contacted and updated with the process and was informed that her pain would be addressed with an alterative narcotic until the Appeal Process was completed. Patient states understanding , denies further questions and is aware we will be contacting her with Dr Terrence Dupont Rossi's recommendations for the method of pain control , oral narcotic vs narcotic patch.

## 2015-10-26 ENCOUNTER — Other Ambulatory Visit: Payer: Self-pay | Admitting: Gynecologic Oncology

## 2015-10-26 DIAGNOSIS — C7889 Secondary malignant neoplasm of other digestive organs: Secondary | ICD-10-CM

## 2015-10-26 DIAGNOSIS — G893 Neoplasm related pain (acute) (chronic): Secondary | ICD-10-CM

## 2015-10-26 DIAGNOSIS — C539 Malignant neoplasm of cervix uteri, unspecified: Secondary | ICD-10-CM

## 2015-10-26 DIAGNOSIS — C772 Secondary and unspecified malignant neoplasm of intra-abdominal lymph nodes: Secondary | ICD-10-CM

## 2015-10-26 DIAGNOSIS — C787 Secondary malignant neoplasm of liver and intrahepatic bile duct: Secondary | ICD-10-CM

## 2015-10-26 MED ORDER — MORPHINE SULFATE ER 15 MG PO TBCR: 15.0000 mg | EXTENDED_RELEASE_TABLET | Freq: Two times a day (BID) | ORAL | Status: AC

## 2015-10-26 NOTE — Telephone Encounter (Signed)
Patient returned call , patient updated with MS contin 15 MG PO Every 12 hours was faxed to [facility] . Patient was also instructed to call [facility] prior to picking up the narcotic to ensure it is available. Patient states hospice will be coming in tomorrow to initiate the hospice admission process . Patient denies further questions at this time .

## 2015-10-26 NOTE — Telephone Encounter (Signed)
Attempted to contact the patient to update with prescription was written for MS Contin 15 MG every 12 hours for pain and that the prescription will be faxed to [facility] as she is a hospice patient now. No answer, left a detailed message with call back requested , Melissa Cross, APNP aware.

## 2015-10-26 NOTE — Progress Notes (Signed)
Since Oxycontin was denied per insurance, new script for morphine SR 15 mg BID written per Dr. Denman George with the patient to take oxycodone IR in between for breakthrough pain

## 2015-10-29 ENCOUNTER — Telehealth: Payer: Self-pay | Admitting: Gynecologic Oncology

## 2015-10-29 NOTE — Telephone Encounter (Signed)
Incoming call from Hospice RN stating the patient is in "a pain crisis with pain around 8 or 9."  Stating the patient cannot take morphine SR because it makes her hyper and restless.  The oxycontin had been denied per her insurance.  Renee stating the patient has better pain control with the percocets.  She was given oxycodone 10 mg to take for breakthrough at the previous visit.  Advised Renee to have the Hospice MD manage her pain and prescribe what he felt would be best for her.    Return to call Renee stating Dr. Denman George did not want the patient to have moderate amounts of tylenol with the percocet.  Renee stating the Hospice MD was going to prescribe methadone and had recommended a regimen with percocet and oxycodone IR.  Advised it would be best for the Hospice MD to prescribe all medications for the patient to ensure the patient's needs are met.

## 2015-12-06 DEATH — deceased

## 2016-11-26 IMAGING — CT CT VENOGRAM ABD-PELV
2 of 7 series · 13 of 46 positions shown, 15 images · IV contrast (iopamidol)
Comparison: CT the abdomen and pelvis 08/15/2010.

CLINICAL DATA: 54-year-old female with history of cervical cancer
and peritoneal carcinomatosis. History of IVC filter. Increasing
lower leg pain. Evaluate for thrombosis.

EXAM:
CT ABDOMEN AND PELVIS WITH CONTRAST
TECHNIQUE: Multidetector CT imaging of the abdomen and pelvis was performed
using the standard protocol following bolus administration of
intravenous contrast.
CONTRAST:  100mL [id number] IOPAMIDOL ([id number]) INJECTION 61%

[Series 2: abd/pel with · axial · 0.82mm/px · z∈[+991,+1386]mm · 10 of 95 slices shown, 12 images]
[im 8/95  soft-tissue]
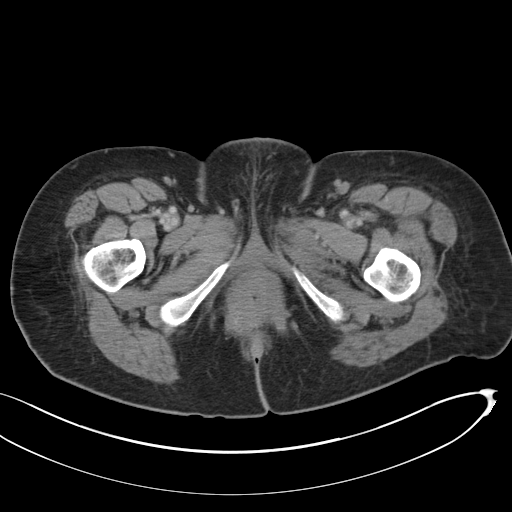
[im 8/95  bone]
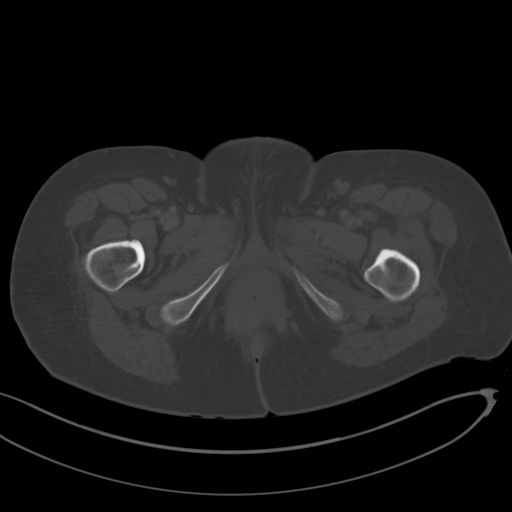
[im 15/95  soft-tissue]
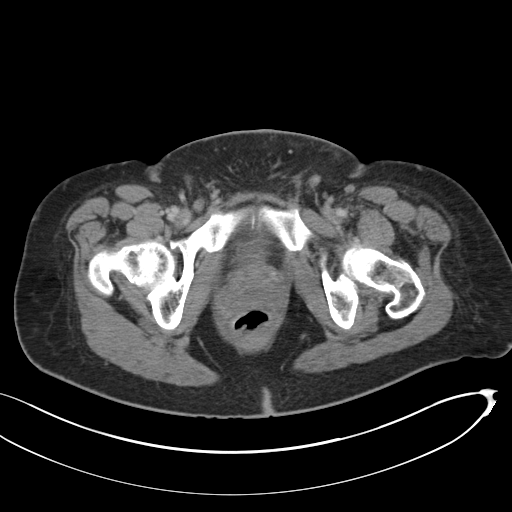
[im 29/95  soft-tissue]
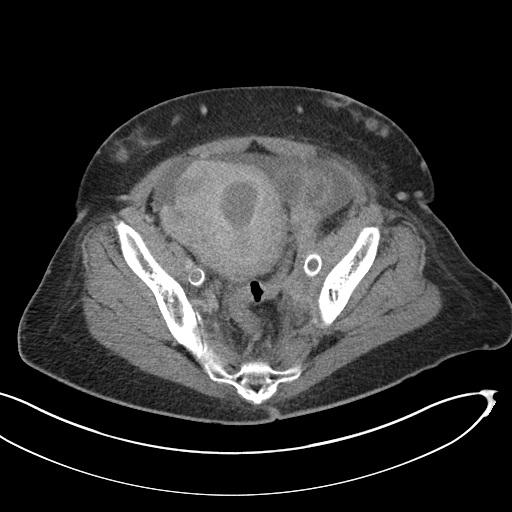
[im 37/95  soft-tissue]
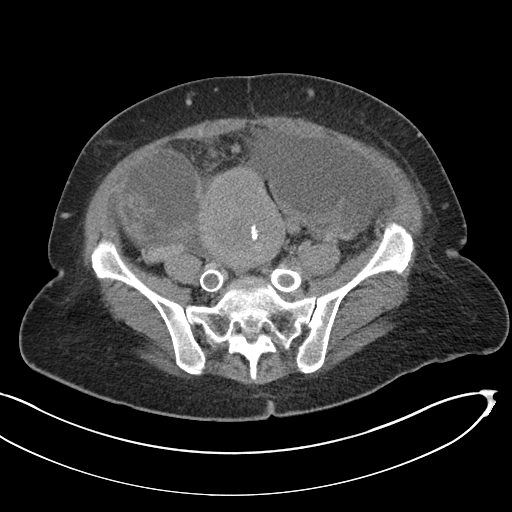
[im 44/95  soft-tissue]
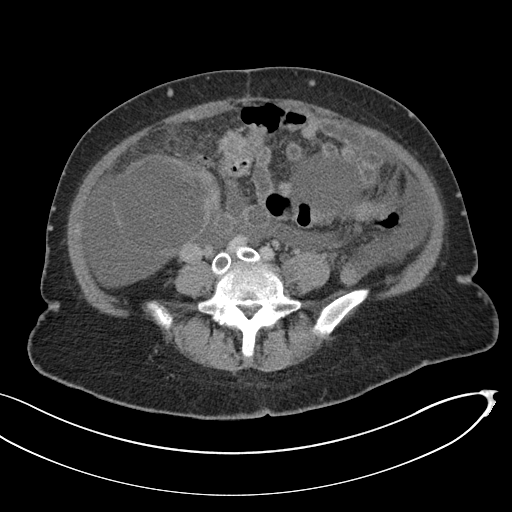
[im 51/95  soft-tissue]
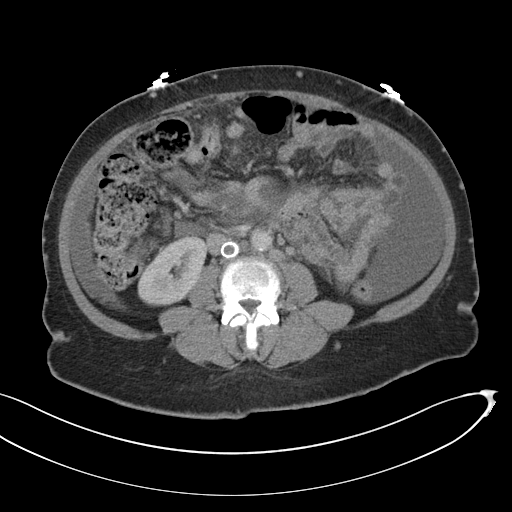
[im 58/95  soft-tissue]
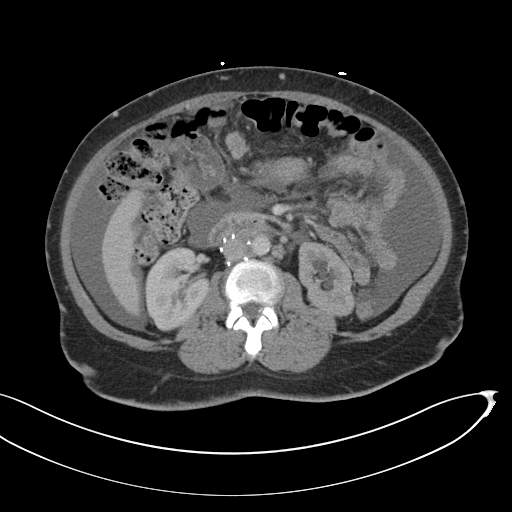
[im 73/95  soft-tissue]
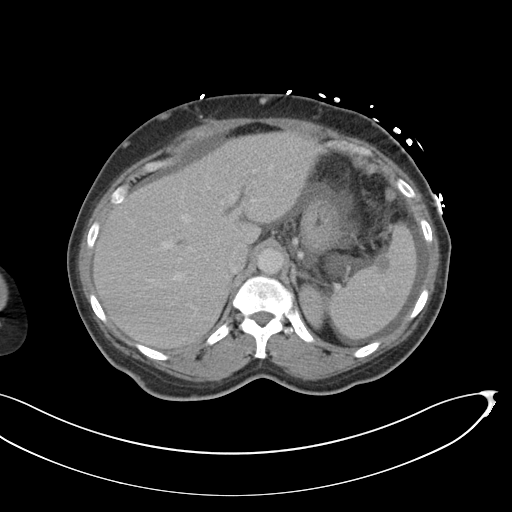
[im 80/95  soft-tissue]
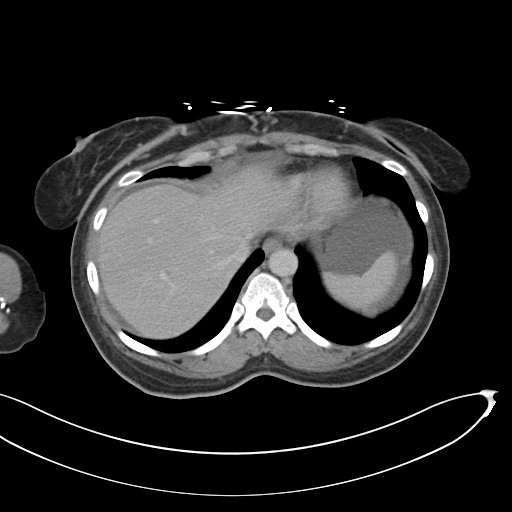
[im 80/95  bone]
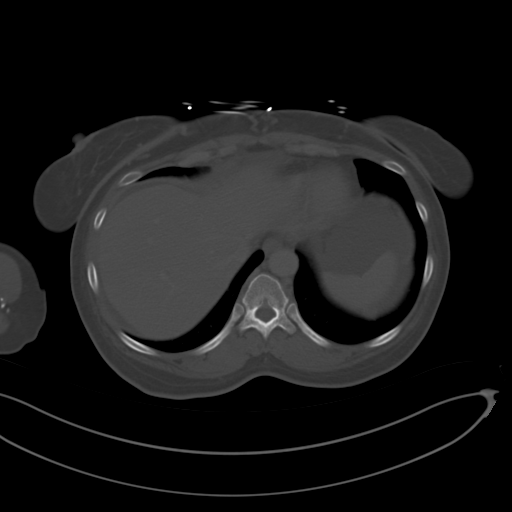
[im 87/95  soft-tissue]
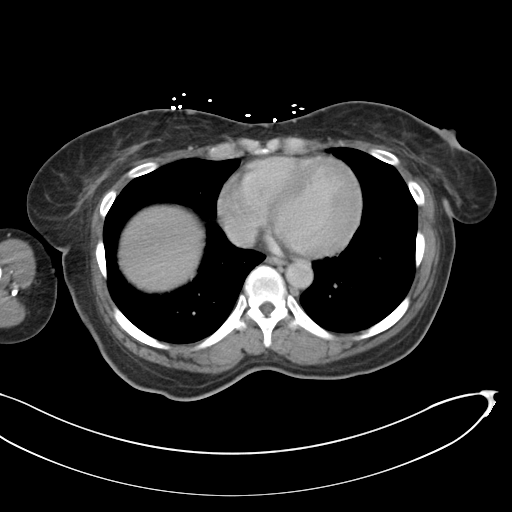

[Series 4: coronal a/|p · coronal · 0.74mm/px · 3 of 111 slices shown]
[im 28/111  soft-tissue]
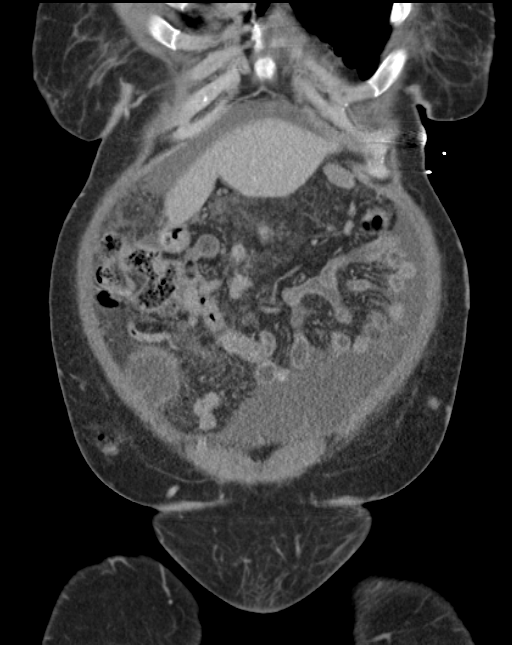
[im 56/111  soft-tissue]
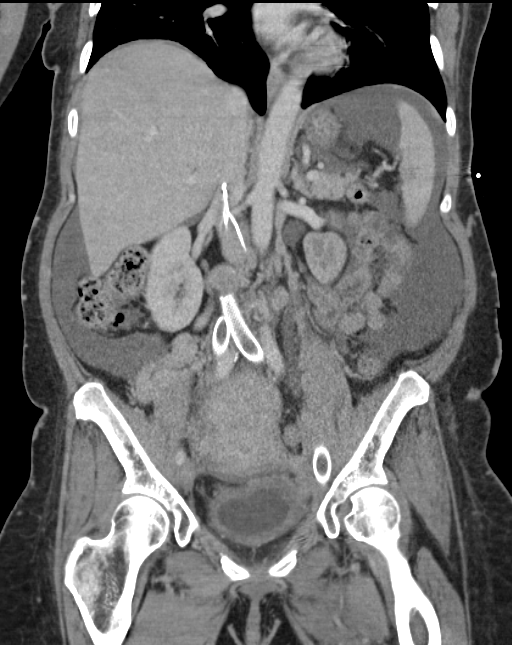
[im 83/111  soft-tissue]
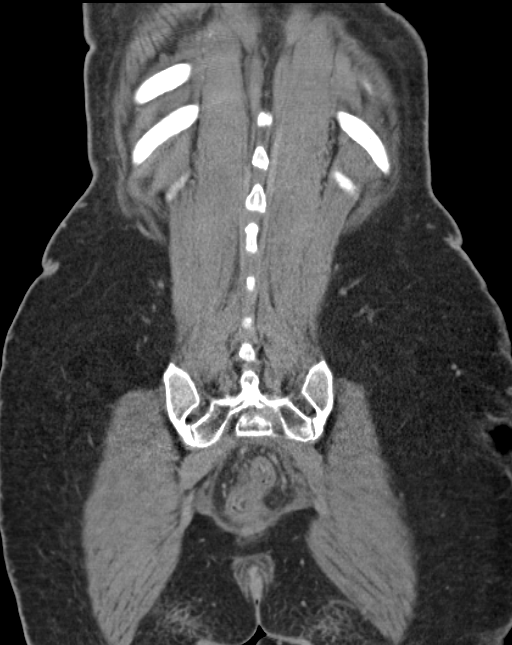

[13 of 46 positions shown; findings below may reference images not displayed]

FINDINGS: Lower chest:  Unremarkable.

Hepatobiliary: 5.2 x 4.5 cm hypovascular mass in the posterior
aspect of the right lobe of the liver involving both segments 6 in
segments 7, presumably a metastatic lesion. No intra or extrahepatic
biliary ductal dilatation. Status post cholecystectomy.

Pancreas: No pancreatic mass. No pancreatic ductal dilatation.
Extensive peripancreatic fluid.

Spleen: There are 2 hypovascular areas near the hilum of the spleen
which persist on delayed phase imaging, suspicious for metastatic
lesions, the largest of which measures up to 1.6 cm (image 24 of
series 2).

Adrenals/Urinary Tract: 11 mm simple cyst in the upper pole of the
right kidney. Left kidney is normal in appearance. Mild left-sided
hydroureteronephrosis. No right-sided hydroureteronephrosis. Urinary
bladder appears mildly thickened cyst, but is partially
decompressed. Bilateral adrenal glands are normal in appearance.

Stomach/Bowel: The appearance of the stomach is normal. There is no
pathologic dilatation of small bowel or colon.

Vascular/Lymphatic: IVC filter in position with tip terminating at
the level of the renal veins. There does not appear to be thrombus
within the IVC filter itself. However, there are long stents beneath
the filter which extend from the external iliac veins bilaterally
into the inferior vena cava, and both of these stents appear to be
nearly (minimal flow in a short portion of the right stent)
completely occluded. There is extensive collateralization around the
stents communicating with the inferior vena cava at the level of the
IVC filter, much of which appears to be coming from dilated right
gonadal veins (multiple varices are noted). Additionally, there are
numerous collaterals throughout the anterior abdominal wall. Mildly
enlarged retroperitoneal lymph node in the left para-aortic nodal
station measuring 1.2 cm in short axis. Soft tissue along the left
pelvic side wall suspicious for left external iliac lymphadenopathy
measuring up to 1.5 cm in diameter (image 69 of series 2), partially
inseparable from the process in the left adnexal region.

Reproductive: Uterus is again markedly enlarged and heterogeneous in
appearance, including what appears to be a large fibroid in the
posterior aspect of the uterus measuring 6.0 x 9.2 x 9.2 cm. Large
amount of fluid in the endometrial canal, measuring up to 3.2 cm in
thickness in the fundus. Lower uterine segment and cervical region
is grossly unremarkable in appearance. The ovaries are markedly
enlarged bilaterally, measuring 8.7 x 8.8 x 8.7 cm on the right and
8.0 x 8.0 x 10.0 cm on the left. These ovaries are predominantly
centrally low-attenuation, but demonstrate thick peripheral
enhancement and some thick internal septations and nodularity, the
overall appearance is most suggestive of neoplasm.

Other: Moderate volume of ascites. Widespread nodularity throughout
the peritoneal cavity, with peritoneal implants measuring up to
x 2.2 cm (image 51 of series 2) associated with the transverse
colon. No pneumoperitoneum.

Musculoskeletal: No aggressive appearing lytic or blastic lesions
are noted in the visualized portions of the skeleton.
IMPRESSION: 1. Although there is no clot associated with the patient's IVC
filter, the long segment stents which extend from the external iliac
veins into the IVC bilaterally are both thrombosed. Extensive venous
collateralization bypasses this obstruction, as discussed above.
2. Peritoneal carcinomatosis with moderate volume of malignant
ascites. In addition, there is some left pelvic sidewall and
retroperitoneal lymphadenopathy, 2 suspicious splenic lesions, as
well as a large hypovascular metastatic lesion in the right lobe of
the liver, as discussed above.
3. Markedly enlarged ovaries bilaterally, the appearance of which is
most typical for primary ovarian neoplasm. While this may relate
seen the underlying metastatic disease to the ovaries, the
development of a secondary neoplasm is not excluded, and clinical
correlation is suggested.
4. Large amount of fluid in the endometrial canal, suggesting post
treatment related cervical stenosis.
5. Large lesion in the posterior aspect of the uterine fundus
favored to represent a large fibroid, as above.
6. Additional incidental findings, as above.
These results were called by telephone at the time of interpretation
on 10/02/2015 at [DATE] to Dr. DEDZI YAONE, who verbally
acknowledged these results.
